# Patient Record
Sex: Female | Born: 1970 | Race: White | Hispanic: No | Marital: Married | Smoking: Never smoker
Health system: Southern US, Community
[De-identification: ages and names within clinical notes are randomized; demographics above are authoritative.]

## PROBLEM LIST (undated history)

## (undated) DIAGNOSIS — T753XXA Motion sickness, initial encounter: Secondary | ICD-10-CM

## (undated) DIAGNOSIS — F32A Depression, unspecified: Secondary | ICD-10-CM

## (undated) DIAGNOSIS — E669 Obesity, unspecified: Secondary | ICD-10-CM

## (undated) DIAGNOSIS — N924 Excessive bleeding in the premenopausal period: Secondary | ICD-10-CM

## (undated) DIAGNOSIS — Z973 Presence of spectacles and contact lenses: Secondary | ICD-10-CM

## (undated) DIAGNOSIS — Q512 Other doubling of uterus, unspecified: Secondary | ICD-10-CM

## (undated) DIAGNOSIS — Q51820 Cervical duplication: Secondary | ICD-10-CM

## (undated) DIAGNOSIS — L709 Acne, unspecified: Secondary | ICD-10-CM

## (undated) DIAGNOSIS — Q5128 Other doubling of uterus, other specified: Secondary | ICD-10-CM

## (undated) DIAGNOSIS — F329 Major depressive disorder, single episode, unspecified: Secondary | ICD-10-CM

## (undated) HISTORY — DX: Excessive bleeding in the premenopausal period: N92.4

## (undated) HISTORY — DX: Other and unspecified doubling of uterus: Q51.28

## (undated) HISTORY — DX: Cervical duplication: Q51.820

## (undated) HISTORY — DX: Acne, unspecified: L70.9

## (undated) HISTORY — PX: SKIN LESION EXCISION: SHX2412

## (undated) HISTORY — DX: Obesity, unspecified: E66.9

## (undated) HISTORY — DX: Other doubling of uterus, unspecified: Q51.20

## (undated) HISTORY — DX: Depression, unspecified: F32.A

## (undated) HISTORY — DX: Major depressive disorder, single episode, unspecified: F32.9

---

## 2004-01-16 ENCOUNTER — Other Ambulatory Visit: Admission: RE | Admit: 2004-01-16 | Discharge: 2004-01-16 | Payer: Self-pay | Admitting: Obstetrics and Gynecology

## 2005-11-13 ENCOUNTER — Ambulatory Visit: Payer: Self-pay | Admitting: Obstetrics & Gynecology

## 2008-05-11 ENCOUNTER — Encounter: Admission: RE | Admit: 2008-05-11 | Discharge: 2008-05-11 | Payer: Self-pay | Admitting: Family Medicine

## 2008-10-31 ENCOUNTER — Encounter: Admission: RE | Admit: 2008-10-31 | Discharge: 2008-10-31 | Payer: Self-pay | Admitting: Family Medicine

## 2009-04-25 ENCOUNTER — Encounter: Admission: RE | Admit: 2009-04-25 | Discharge: 2009-04-25 | Payer: Self-pay | Admitting: Family Medicine

## 2011-03-17 ENCOUNTER — Other Ambulatory Visit: Payer: Self-pay | Admitting: Unknown Physician Specialty

## 2011-03-20 ENCOUNTER — Other Ambulatory Visit: Payer: Self-pay | Admitting: Family Medicine

## 2011-03-20 DIAGNOSIS — N644 Mastodynia: Secondary | ICD-10-CM

## 2011-03-21 ENCOUNTER — Other Ambulatory Visit: Payer: Self-pay | Admitting: Family Medicine

## 2011-03-21 DIAGNOSIS — N920 Excessive and frequent menstruation with regular cycle: Secondary | ICD-10-CM

## 2011-03-31 ENCOUNTER — Ambulatory Visit
Admission: RE | Admit: 2011-03-31 | Discharge: 2011-03-31 | Disposition: A | Discharge status: Home / Self Care | Payer: BC Managed Care – PPO | Source: Ambulatory Visit | Attending: Family Medicine | Admitting: Family Medicine

## 2011-03-31 DIAGNOSIS — N920 Excessive and frequent menstruation with regular cycle: Secondary | ICD-10-CM

## 2011-03-31 DIAGNOSIS — N644 Mastodynia: Secondary | ICD-10-CM

## 2012-05-13 ENCOUNTER — Ambulatory Visit: Payer: Self-pay

## 2012-05-13 LAB — TROPONIN I: Troponin-I: <0.02 ng/mL

## 2012-05-13 LAB — URINALYSIS, COMPLETE
Leukocyte Esterase: NEGATIVE
Nitrite: NEGATIVE
Protein: NEGATIVE
Specific Gravity: 1.005 (ref 1.003–1.030)

## 2012-05-13 LAB — COMPREHENSIVE METABOLIC PANEL
Alkaline Phosphatase: 58 U/L (ref 50–136)
BUN: 20 mg/dL — ABNORMAL HIGH (ref 7–18)
Calcium, Total: 9.7 mg/dL (ref 8.5–10.1)
EGFR (African American): >60
EGFR (Non-African Amer.): >60
Glucose: 89 mg/dL (ref 65–99)
Osmolality: 287 (ref 275–301)

## 2012-05-13 LAB — CBC WITH DIFFERENTIAL/PLATELET
Basophil %: 1 %
Eosinophil #: 0 10*3/uL (ref 0.0–0.7)
HGB: 13.8 g/dL (ref 12.0–16.0)
MCH: 33.2 pg (ref 26.0–34.0)
MCHC: 33.8 g/dL (ref 32.0–36.0)
MCV: 98 fL (ref 80–100)
Monocyte #: 0.4 x10 3/mm (ref 0.2–0.9)
Neutrophil #: 4.3 10*3/uL (ref 1.4–6.5)

## 2012-11-11 ENCOUNTER — Other Ambulatory Visit: Payer: Self-pay

## 2012-11-11 DIAGNOSIS — Z1231 Encounter for screening mammogram for malignant neoplasm of breast: Secondary | ICD-10-CM

## 2012-12-15 ENCOUNTER — Ambulatory Visit
Admission: RE | Admit: 2012-12-15 | Discharge: 2012-12-15 | Disposition: A | Discharge status: Home / Self Care | Payer: BC Managed Care – PPO | Source: Ambulatory Visit

## 2012-12-15 DIAGNOSIS — Z1231 Encounter for screening mammogram for malignant neoplasm of breast: Secondary | ICD-10-CM

## 2012-12-16 ENCOUNTER — Other Ambulatory Visit: Payer: Self-pay | Admitting: Family Medicine

## 2012-12-16 DIAGNOSIS — N6452 Nipple discharge: Secondary | ICD-10-CM

## 2013-04-08 ENCOUNTER — Ambulatory Visit
Admission: RE | Admit: 2013-04-08 | Discharge: 2013-04-08 | Disposition: A | Discharge status: Home / Self Care | Payer: BC Managed Care – PPO | Source: Ambulatory Visit | Attending: Family Medicine | Admitting: Family Medicine

## 2013-04-08 DIAGNOSIS — N6452 Nipple discharge: Secondary | ICD-10-CM

## 2015-10-18 ENCOUNTER — Other Ambulatory Visit: Payer: Self-pay

## 2015-10-18 DIAGNOSIS — Z1231 Encounter for screening mammogram for malignant neoplasm of breast: Secondary | ICD-10-CM

## 2015-11-01 ENCOUNTER — Ambulatory Visit
Admission: RE | Admit: 2015-11-01 | Discharge: 2015-11-01 | Disposition: A | Discharge status: Home / Self Care | Payer: BC Managed Care – PPO | Source: Ambulatory Visit

## 2015-11-01 DIAGNOSIS — Z1231 Encounter for screening mammogram for malignant neoplasm of breast: Secondary | ICD-10-CM

## 2015-12-26 ENCOUNTER — Ambulatory Visit (INDEPENDENT_AMBULATORY_CARE_PROVIDER_SITE_OTHER): Payer: BC Managed Care – PPO | Admitting: Unknown Physician Specialty

## 2015-12-26 ENCOUNTER — Encounter: Payer: Self-pay | Admitting: Unknown Physician Specialty

## 2015-12-26 VITALS — BP 126/77 | HR 66 | Temp 98.1°F | Ht 67.2 in | Wt 169.2 lb

## 2015-12-26 DIAGNOSIS — N938 Other specified abnormal uterine and vaginal bleeding: Secondary | ICD-10-CM

## 2015-12-26 DIAGNOSIS — Z0000 Encounter for general adult medical examination without abnormal findings: Secondary | ICD-10-CM | POA: Diagnosis not present

## 2015-12-26 DIAGNOSIS — Q51820 Cervical duplication: Secondary | ICD-10-CM

## 2015-12-26 MED ORDER — LEVONORGESTREL-ETHINYL ESTRAD 0.1-20 MG-MCG PO TABS: 1.0000 | ORAL_TABLET | Freq: Every day | ORAL | Status: DC

## 2015-12-26 NOTE — Progress Notes (Signed)
BP 126/77 mmHg  Pulse 66  Temp(Src) 98.1 F (36.7 C)  Ht 5' 7.2" (1.707 m)  Wt 169 lb 3.2 oz (76.749 kg)  BMI 26.34 kg/m2  SpO2 100%  LMP 12/07/2015 (Exact Date)   Subjective:    Patient ID: Jaclyn West, female    DOB: 01/18/1971, 45 y.o.   MRN: [id number]  HPI: Jaclyn West is a 45 y.o. female  Chief Complaint  Patient presents with  . Establish Care  . Annual Exam   Menstrual bleeding Pt states she is having intermittent clots for about a week, then heavy bleeding and pain for about 2 days.  Periods are monthly.  She is getting a lot of PMS symptoms of wanting to eat "everything that is not nailed down."    Past Surgical History  Procedure Laterality Date  . Cesarean section      x 2  . Skin lesion excision     Past Medical History  Diagnosis Date  . Obesity   . Depression   . Acne   . Uterus didelphys   . Double cervix   . Premenopause menorrhagia       Relevant past medical, surgical, family and social history reviewed and updated as indicated. Interim medical history since our last visit reviewed. Allergies and medications reviewed and updated.  Review of Systems  Per HPI unless specifically indicated above     Objective:    BP 126/77 mmHg  Pulse 66  Temp(Src) 98.1 F (36.7 C)  Ht 5' 7.2" (1.707 m)  Wt 169 lb 3.2 oz (76.749 kg)  BMI 26.34 kg/m2  SpO2 100%  LMP 12/07/2015 (Exact Date)  Wt Readings from Last 3 Encounters:  12/26/15 169 lb 3.2 oz (76.749 kg)    Physical Exam  Constitutional: She is oriented to person, place, and time. She appears well-developed and well-nourished.  HENT:  Head: Normocephalic and atraumatic.  Eyes: Pupils are equal, round, and reactive to light. Right eye exhibits no discharge. Left eye exhibits no discharge. No scleral icterus.  Neck: Normal range of motion. Neck supple. Carotid bruit is not present. No thyromegaly present.  Cardiovascular: Normal rate, regular rhythm and normal heart sounds.  Exam reveals  no gallop and no friction rub.   No murmur heard. Pulmonary/Chest: Effort normal and breath sounds normal. No respiratory distress. She has no wheezes. She has no rales.  Abdominal: Soft. Bowel sounds are normal. There is no tenderness. There is no rebound.  Genitourinary: Vagina normal. No breast swelling, tenderness or discharge.  Pt with double cervix and uterus consistent with exam.    Musculoskeletal: Normal range of motion.  Lymphadenopathy:    She has no cervical adenopathy.  Neurological: She is alert and oriented to person, place, and time.  Skin: Skin is warm, dry and intact. No rash noted.  Psychiatric: She has a normal mood and affect. Her speech is normal and behavior is normal. Judgment and thought content normal. Cognition and memory are normal.    Results for orders placed or performed in visit on 05/13/12  Troponin I  Result Value Ref Range   Troponin-I < 0.02 ng/mL      Assessment & Plan:   Problem List Items Addressed This Visit      Unprioritized   Double cervix    Discussed pt with Ob-Gyn who stated no further work-up needed as her periods are monthly and 7 days. Will rx BCPs for continuous use and recheck in 3 months  Uterine bleeding, dysfunctional - Primary   Relevant Orders   CBC with Differential/Platelet    Other Visit Diagnoses    Annual physical exam        Relevant Orders    Comprehensive metabolic panel    TSH    IGP, Aptima HPV, rfx 16/18,45    Lipid Panel w/o Chol/HDL Ratio        Follow up plan: Return in about 3 months (around 03/27/2016) for for 30 minutes as also needs wart removal.

## 2015-12-26 NOTE — Assessment & Plan Note (Addendum)
Discussed pt with Ob-Gyn who stated no further work-up needed as her periods are monthly and 7 days. Will rx BCPs for continuous use and recheck in 3 months

## 2015-12-27 LAB — COMPREHENSIVE METABOLIC PANEL
A/G RATIO: 1.7 (ref 1.2–2.2)
ALK PHOS: 65 IU/L (ref 39–117)
ALT: 18 IU/L (ref 0–32)
AST: 17 IU/L (ref 0–40)
Albumin: 4.5 g/dL (ref 3.5–5.5)
BILIRUBIN TOTAL: 0.3 mg/dL (ref 0.0–1.2)
BUN/Creatinine Ratio: 17 (ref 9–23)
BUN: 16 mg/dL (ref 6–24)
CALCIUM: 10 mg/dL (ref 8.7–10.2)
CHLORIDE: 100 mmol/L (ref 96–106)
CO2: 22 mmol/L (ref 18–29)
Creatinine, Ser: 0.92 mg/dL (ref 0.57–1.00)
GFR calc Af Amer: 88 mL/min/{1.73_m2} (ref >59)
GFR, EST NON AFRICAN AMERICAN: 76 mL/min/{1.73_m2} (ref >59)
GLOBULIN, TOTAL: 2.6 g/dL (ref 1.5–4.5)
Glucose: 87 mg/dL (ref 65–99)
POTASSIUM: 5.5 mmol/L — AB (ref 3.5–5.2)
SODIUM: 140 mmol/L (ref 134–144)
Total Protein: 7.1 g/dL (ref 6.0–8.5)

## 2015-12-27 LAB — CBC WITH DIFFERENTIAL/PLATELET
BASOS: 1 %
Basophils Absolute: 0.1 10*3/uL (ref 0.0–0.2)
EOS (ABSOLUTE): 0.2 10*3/uL (ref 0.0–0.4)
EOS: 3 %
HEMATOCRIT: 42.1 % (ref 34.0–46.6)
Hemoglobin: 13.6 g/dL (ref 11.1–15.9)
Immature Grans (Abs): 0 10*3/uL (ref 0.0–0.1)
Immature Granulocytes: 0 %
LYMPHS ABS: 2.3 10*3/uL (ref 0.7–3.1)
Lymphs: 37 %
MCH: 31.3 pg (ref 26.6–33.0)
MCHC: 32.3 g/dL (ref 31.5–35.7)
MCV: 97 fL (ref 79–97)
MONOS ABS: 0.5 10*3/uL (ref 0.1–0.9)
Monocytes: 7 %
NEUTROS ABS: 3.2 10*3/uL (ref 1.4–7.0)
Neutrophils: 52 %
PLATELETS: 302 10*3/uL (ref 150–379)
RBC: 4.35 x10E6/uL (ref 3.77–5.28)
RDW: 13.6 % (ref 12.3–15.4)
WBC: 6.3 10*3/uL (ref 3.4–10.8)

## 2015-12-27 LAB — LIPID PANEL W/O CHOL/HDL RATIO
CHOLESTEROL TOTAL: 199 mg/dL (ref 100–199)
HDL: 77 mg/dL (ref >39)
LDL CALC: 113 mg/dL — AB (ref 0–99)
Triglycerides: 47 mg/dL (ref 0–149)
VLDL Cholesterol Cal: 9 mg/dL (ref 5–40)

## 2015-12-27 LAB — TSH: TSH: 1.97 u[IU]/mL (ref 0.450–4.500)

## 2015-12-28 ENCOUNTER — Encounter: Payer: Self-pay | Admitting: Unknown Physician Specialty

## 2015-12-29 LAB — IGP, APTIMA HPV, RFX 16/18,45
HPV Aptima: NEGATIVE
PAP Smear Comment: 0

## 2016-03-28 ENCOUNTER — Ambulatory Visit: Payer: BC Managed Care – PPO | Admitting: Unknown Physician Specialty

## 2016-04-14 ENCOUNTER — Ambulatory Visit
Admission: EM | Admit: 2016-04-14 | Discharge: 2016-04-14 | Disposition: A | Discharge status: Home / Self Care | Payer: BC Managed Care – PPO | Attending: Family Medicine | Admitting: Family Medicine

## 2016-04-14 ENCOUNTER — Telehealth: Payer: Self-pay | Admitting: Unknown Physician Specialty

## 2016-04-14 ENCOUNTER — Ambulatory Visit: Payer: BC Managed Care – PPO

## 2016-04-14 ENCOUNTER — Ambulatory Visit (INDEPENDENT_AMBULATORY_CARE_PROVIDER_SITE_OTHER)
Admit: 2016-04-14 | Discharge: 2016-04-14 | Disposition: A | Discharge status: Home / Self Care | Payer: BC Managed Care – PPO | Attending: Family Medicine | Admitting: Family Medicine

## 2016-04-14 ENCOUNTER — Encounter: Payer: Self-pay | Admitting: Emergency Medicine

## 2016-04-14 DIAGNOSIS — G4489 Other headache syndrome: Secondary | ICD-10-CM

## 2016-04-14 LAB — PREGNANCY, URINE: PREG TEST UR: NEGATIVE

## 2016-04-14 MED ORDER — BUTALBITAL-APAP-CAFFEINE 50-325-40 MG PO TABS: 1.0000 | ORAL_TABLET | Freq: Four times a day (QID) | ORAL | 0 refills | Status: DC | PRN

## 2016-04-14 NOTE — Telephone Encounter (Signed)
Routing to provider  

## 2016-04-14 NOTE — ED Provider Notes (Signed)
[facility]    CSN: [id number] Arrival date & time: 04/14/16  [id number]     History   Chief Complaint Chief Complaint  Patient presents with  . Headache    HPI Jaclyn West is a 45 y.o. female.   Patient's here because of of headache headache started Friday. She states she normally does not have headaches. Only time she's had headaches was twice when she was pregnant and when she had stopped caffeine because the pregnancy. She denies having total or no signs of trauma and she denies any infection or illness causing her pain she denies any nausea no photophobia but states the headache is throbbing she try to run this weekend and was unable to run. She states she's had some hip pain and she take some Aleve for that was wondering whether the Aleve may cause her headache. She does not smoke she states that she denies any chronic medication no significant family medical problems no known drug allergies. 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 She denies any family medical history is significant pertinent to today's visit   The history is provided by the patient. No language interpreter was used.  Headache  Pain location:  Occipital Quality:  Sharp Radiates to:  Does not radiate Severity currently:  7/10 Severity at highest:  8/10 Onset quality:  Sudden Timing:  Constant Progression:  Worsening Chronicity:  New Similar to prior headaches: no   Context: not activity, not exposure to bright light, not caffeine, not coughing, not defecating, not eating, not stress, not exposure to cold air, not intercourse, not loud noise and not straining   Relieved by:  NSAIDs Worsened by:  Activity Ineffective treatments:  None tried Associated symptoms: myalgias   Associated symptoms: no back pain, no eye pain, no neck pain and no photophobia     Past Medical History:  Diagnosis Date  . Acne   . Depression   . Double cervix   . Obesity   .  Premenopause menorrhagia   . Uterus didelphys     Patient Active Problem List   Diagnosis Date Noted  . Uterine bleeding, dysfunctional 12/26/2015  . Double cervix 12/26/2015    Past Surgical History:  Procedure Laterality Date  . CESAREAN SECTION     x 2  . SKIN LESION EXCISION      OB History    No data available       Home Medications    Prior to Admission medications   Medication Sig Start Date End Date Taking? Authorizing Provider  Melatonin 1 MG TABS Take by mouth as needed.   Yes Historical Provider, MD  levonorgestrel-ethinyl estradiol (AVIANE,ALESSE,LESSINA) 0.1-20 MG-MCG tablet Take 1 tablet by mouth daily. Continuous use of active pills.  Skip placebo pills 12/26/15   Kathrine Haddock, NP    Family History Family History  Problem Relation Age of Onset  . Hypothyroidism Mother   . Hypertension Mother   . Arthritis Mother   . Heart disease Father     MI  . Heart disease Brother   . Asthma Son   . Heart disease Maternal Grandmother     CHF  . Stroke Maternal Grandfather   . Heart disease Paternal Grandfather   . Heart disease Brother     Social History Social History  Substance Use Topics  . Smoking status: Never Smoker  . Smokeless tobacco: Never Used  . Alcohol use No     Allergies   Iodinated diagnostic  agents and Red dye   Review of Systems Review of Systems  Eyes: Negative for photophobia, pain, discharge, redness and visual disturbance.  Musculoskeletal: Positive for myalgias. Negative for back pain and neck pain.  Neurological: Positive for headaches.  All other systems reviewed and are negative.    Physical Exam Triage Vital Signs ED Triage Vitals  Enc Vitals Group     BP 04/14/16 0835 (!) 149/91     Pulse Rate 04/14/16 0835 63     Resp 04/14/16 0835 16     Temp 04/14/16 0835 98.1 F (36.7 C)     Temp Source 04/14/16 0835 Oral     SpO2 04/14/16 0835 100 %     Weight 04/14/16 0835 160 lb (72.6 kg)     Height 04/14/16 0835 5'  7" (1.702 m)     Head Circumference --      Peak Flow --      Pain Score 04/14/16 0836 6     Pain Loc --      Pain Edu? --      Excl. in [place]? --    No data found.   Updated Vital Signs BP (!) 149/91 (BP Location: Left Arm)   Pulse 63   Temp 98.1 F (36.7 C) (Oral)   Resp 16   Ht 5\' 7"  (1.702 m)   Wt 160 lb (72.6 kg)   LMP 02/13/2016 (Approximate)   SpO2 100%   BMI 25.06 kg/m   Visual Acuity Right Eye Distance:   Left Eye Distance:   Bilateral Distance:    Right Eye Near:   Left Eye Near:    Bilateral Near:     Physical Exam  Constitutional: She is oriented to person, place, and time. She appears well-developed and well-nourished. No distress.  HENT:  Head: Normocephalic and atraumatic.    Right Ear: External ear normal.  Left Ear: External ear normal.  Mouth/Throat: Oropharynx is clear and moist.  Tenderness over the left occipital area and was able to reduce pain of the eccentric area when this area was palpated  Eyes: Conjunctivae and EOM are normal. Pupils are equal, round, and reactive to light.  Neck: Normal range of motion. Neck supple.  Pulmonary/Chest: Effort normal.  Musculoskeletal: Normal range of motion. She exhibits tenderness. She exhibits no edema.  Lymphadenopathy:    She has no cervical adenopathy.  Neurological: She is alert and oriented to person, place, and time. She has normal reflexes. No cranial nerve deficit. Coordination normal.  Skin: Skin is warm and dry. She is not diaphoretic.  Psychiatric: She has a normal mood and affect.  Vitals reviewed.    UC Treatments / Results  Labs (all labs ordered are listed, but only abnormal results are displayed) Labs Reviewed  PREGNANCY, URINE    EKG  EKG Interpretation None       Radiology Ct Head Wo Contrast  Result Date: 04/14/2016 CLINICAL DATA:  Headache for 2 days along the base of the skull. EXAM: CT HEAD WITHOUT CONTRAST TECHNIQUE: Contiguous axial images were obtained from  the base of the skull through the vertex without intravenous contrast. COMPARISON:  None. FINDINGS: Brain: The brainstem, cerebellum, cerebral peduncles, thalami, basal ganglia, basilar cisterns, and ventricular system appear within normal limits. Slight hypodensity in the white matter along the occipital horn a right lateral ventricle is ascribed to volume averaging of the slightly asymmetric occipital horn, essentially normal variant. No intracranial hemorrhage, mass lesion, or acute CVA. Vascular: Unremarkable Skull: Unremarkable Sinuses/Orbits:  Unremarkable Other: No supplemental non-categorized findings. IMPRESSION: 1. No significant intracranial abnormality is identified to explain the patient's symptoms. Electronically Signed   By: Van Clines M.D.   On: 04/14/2016 11:09    Procedures Procedures (including critical care time)  Medications Ordered in UC Medications - No data to display   Initial Impression / Assessment and Plan / UC Course  I have reviewed the triage vital signs and the nursing notes.  Pertinent labs & imaging results that were available during my care of the patient were reviewed by me and considered in my medical decision making (see chart for details).  Clinical Course   At this time two different pathways could be taken. We have a middle-aged female with new onset headaches worsen the life and we can get diagnostic tests to make sure that there is nothing wrong intracranially such as a bleed or mass. Or so some very suspicious this could be a occipital headache we could go and try to inject the left occipital notch/groove and see if we can make improvement she is opted for the CT scan and will go with that I did explain to her that we can do one of the other but not both today.  Final Clinical Impressions(s) / UC Diagnoses   Final diagnoses:  Other headache syndrome    New Prescriptions New Prescriptions   No medications on file    CT scan was negative  we'll place on fioricet 1 tablet every 6-8 hours when necessary basis and if headache does not improve to please follow-up with PCP later this week or next week.   Frederich Cha, MD 04/14/16 [phone]

## 2016-04-14 NOTE — ED Triage Notes (Signed)
Patient c/o headache that started Friday night.

## 2016-04-14 NOTE — Telephone Encounter (Signed)
Called and left patient a VM letting her know what Cheryl said.  

## 2016-04-14 NOTE — Telephone Encounter (Signed)
Pt called stated she is at the Urgent Care in [place]. She has had a headache for 3 days this is abnormal. Do we do injections for Occipital headaches at the base of the skull or does she need to go somewhere else. Please call pt back ASAP. Thanks.

## 2016-04-14 NOTE — Telephone Encounter (Signed)
No, we do not

## 2016-04-16 ENCOUNTER — Encounter: Payer: Self-pay | Admitting: Family Medicine

## 2016-04-16 ENCOUNTER — Ambulatory Visit (INDEPENDENT_AMBULATORY_CARE_PROVIDER_SITE_OTHER): Payer: BC Managed Care – PPO | Admitting: Family Medicine

## 2016-04-16 VITALS — BP 132/86 | HR 67 | Temp 98.5°F | Wt 171.0 lb

## 2016-04-16 DIAGNOSIS — G4489 Other headache syndrome: Secondary | ICD-10-CM

## 2016-04-16 DIAGNOSIS — G44209 Tension-type headache, unspecified, not intractable: Secondary | ICD-10-CM | POA: Diagnosis not present

## 2016-04-16 MED ORDER — TRIAMCINOLONE ACETONIDE 40 MG/ML IJ SUSP
40.0000 mg | Freq: Once | INTRAMUSCULAR | Status: AC
  Administered 2016-04-16: 40 mg via INTRAMUSCULAR

## 2016-04-16 MED ORDER — CYCLOBENZAPRINE HCL 10 MG PO TABS: 10.0000 mg | ORAL_TABLET | Freq: Three times a day (TID) | ORAL | 0 refills | Status: DC | PRN

## 2016-04-16 NOTE — Patient Instructions (Signed)
Follow up as needed

## 2016-04-16 NOTE — Progress Notes (Signed)
   BP 132/86   Pulse 67   Temp 98.5 F (36.9 C)   Wt 171 lb (77.6 kg)   LMP 02/13/2016 (Approximate)   SpO2 100%   BMI 26.78 kg/m    Subjective:    Patient ID: Jaclyn West, female    DOB: [birth year], 45 y.o.   MRN: [id number]  HPI: Jaclyn West is a 45 y.o. female  Chief Complaint  Patient presents with  . Headache    since Friday at the base of her skull. Sometimes throbbing pain, but mostly a dull ache. Went to UC on Monday.They gave her Fiorcet but she didn't take it.    Patient presents with 5 day history of persistent HA in occipital region. Minimal light sensitivity, some mild intermittent nausea. Denies visual changes. Took ibuprofen over the weekend, went to UC 2 days ago and was given fioricet but has not taken any. What has helped the most is putting a heating pad over the area and soaking in hot epsom salt baths. It is some better today. Denies fever, chills, CP, SOB, weakness, or dizziness.   Relevant past medical, surgical, family and social history reviewed and updated as indicated. Interim medical history since our last visit reviewed. Allergies and medications reviewed and updated.  Review of Systems  Constitutional: Negative.   Eyes: Positive for photophobia.  Respiratory: Negative.   Cardiovascular: Negative.   Gastrointestinal: Positive for nausea.  Genitourinary: Negative.   Musculoskeletal: Negative.   Skin: Negative.   Neurological: Positive for headaches.  Psychiatric/Behavioral: Negative.     Per HPI unless specifically indicated above     Objective:    BP 132/86   Pulse 67   Temp 98.5 F (36.9 C)   Wt 171 lb (77.6 kg)   LMP 02/13/2016 (Approximate)   SpO2 100%   BMI 26.78 kg/m   Wt Readings from Last 3 Encounters:  04/16/16 171 lb (77.6 kg)  04/14/16 160 lb (72.6 kg)  12/26/15 169 lb 3.2 oz (76.7 kg)    Physical Exam  Constitutional: She is oriented to person, place, and time. She appears well-developed and well-nourished. No  distress.  HENT:  Head: Atraumatic.  Eyes: Conjunctivae are normal. Pupils are equal, round, and reactive to light. No scleral icterus.  Neck: Normal range of motion. Neck supple.  Cardiovascular: Normal rate and normal heart sounds.   Pulmonary/Chest: Effort normal and breath sounds normal. No respiratory distress.  Musculoskeletal: Normal range of motion.  Moderate TTP over base of head and posterior neck, mild TTP over b/l trapezius muscles   Lymphadenopathy:    She has no cervical adenopathy.  Neurological: She is alert and oriented to person, place, and time.  Skin: Skin is warm and dry.  Psychiatric: She has a normal mood and affect. Her behavior is normal.  Nursing note and vitals reviewed.     Assessment & Plan:   Problem List Items Addressed This Visit    None    Visit Diagnoses    Acute non intractable tension-type headache    -  Primary   IM Kenalog given today, flexeril sent. Recommended to continue heating pad and epsom soaks, rest, can take one or two doses of fioricet over the next day or two   Relevant Medications   cyclobenzaprine (FLEXERIL) 10 MG tablet   triamcinolone acetonide (KENALOG-40) injection 40 mg (Completed)       Follow up plan: Return if symptoms worsen or fail to improve.

## 2016-04-22 ENCOUNTER — Ambulatory Visit (INDEPENDENT_AMBULATORY_CARE_PROVIDER_SITE_OTHER): Payer: BC Managed Care – PPO | Admitting: Family Medicine

## 2016-04-22 ENCOUNTER — Encounter: Payer: Self-pay | Admitting: Family Medicine

## 2016-04-22 VITALS — BP 133/84 | HR 72 | Temp 98.2°F | Ht 68.1 in | Wt 173.6 lb

## 2016-04-22 DIAGNOSIS — J069 Acute upper respiratory infection, unspecified: Secondary | ICD-10-CM | POA: Diagnosis not present

## 2016-04-22 MED ORDER — AMOXICILLIN-POT CLAVULANATE 875-125 MG PO TABS: 1.0000 | ORAL_TABLET | Freq: Two times a day (BID) | ORAL | 0 refills | Status: DC

## 2016-04-22 MED ORDER — LIDOCAINE VISCOUS 2 % MT SOLN: 5.0000 mL | OROMUCOSAL | 0 refills | Status: DC | PRN

## 2016-04-22 NOTE — Patient Instructions (Signed)
Follow up as needed

## 2016-04-22 NOTE — Progress Notes (Signed)
   BP 133/84 (BP Location: Left Arm, Patient Position: Sitting, Cuff Size: Large)   Pulse 72   Temp 98.2 F (36.8 C)   Ht 5' 8.1" (1.73 m)   Wt 173 lb 9.6 oz (78.7 kg)   LMP 02/13/2016   SpO2 98%   BMI 26.32 kg/m    Subjective:    Patient ID: Jaclyn West, female    DOB: 09-28-1970, 45 y.o.   MRN: [id number]  HPI: Jaclyn West is a 45 y.o. female  Chief Complaint  Patient presents with  . Sore Throat    pt states her throat started bothering her last week and has gotten worse, states it is almost unbearable    Patient presents with a 1 week history of congestion, and cough. Severe sore throat x 3-4 days, worst at night. States her throat feels like it is on fire and she can't eat or sleep. Intermittent fevers and chills. 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   Relevant past medical, surgical, family and social history reviewed and updated as indicated. Interim medical history since our last visit reviewed. Allergies and medications reviewed and updated.  Review of Systems  Constitutional: Positive for chills and fever.  HENT: Positive for congestion and sore throat.   Eyes: Negative.   Respiratory: Positive for cough.   Cardiovascular: Negative.   Gastrointestinal: Negative.   Genitourinary: Negative.   Musculoskeletal: Positive for myalgias.  Skin: Negative.   Neurological: Negative.   Psychiatric/Behavioral: Negative.     Per HPI unless specifically indicated above     Objective:    BP 133/84 (BP Location: Left Arm, Patient Position: Sitting, Cuff Size: Large)   Pulse 72   Temp 98.2 F (36.8 C)   Ht 5' 8.1" (1.73 m)   Wt 173 lb 9.6 oz (78.7 kg)   LMP 02/13/2016   SpO2 98%   BMI 26.32 kg/m   Wt Readings from Last 3 Encounters:  04/22/16 173 lb 9.6 oz (78.7 kg)  04/16/16 171 lb (77.6 kg)  04/14/16 160 lb (72.6 kg)    Physical Exam  Results for orders placed or performed during the hospital  encounter of 04/14/16  Pregnancy, urine  Result Value Ref Range   Preg Test, Ur NEGATIVE NEGATIVE      Assessment & Plan:   Problem List Items Addressed This Visit    None    Visit Diagnoses    Upper respiratory tract infection, unspecified type    -  Primary   Strep neg, likely viral but recommended if no better in the next day or two to start the augmentin. Await strep cx. Viscous lidocaine sent.    Relevant Orders   Rapid strep screen (not at [facility])       Follow up plan: Return if symptoms worsen or fail to improve.

## 2016-04-24 LAB — RAPID STREP SCREEN (MED CTR MEBANE ONLY): Strep Gp A Ag, IA W/Reflex: NEGATIVE

## 2016-04-24 LAB — CULTURE, GROUP A STREP: STREP A CULTURE: NEGATIVE

## 2016-05-05 ENCOUNTER — Ambulatory Visit (INDEPENDENT_AMBULATORY_CARE_PROVIDER_SITE_OTHER): Payer: BC Managed Care – PPO | Admitting: Unknown Physician Specialty

## 2016-05-05 ENCOUNTER — Encounter: Payer: Self-pay | Admitting: Unknown Physician Specialty

## 2016-05-05 VITALS — BP 133/86 | HR 76 | Temp 98.3°F | Ht 67.1 in | Wt 167.2 lb

## 2016-05-05 DIAGNOSIS — Z23 Encounter for immunization: Secondary | ICD-10-CM | POA: Diagnosis not present

## 2016-05-05 DIAGNOSIS — N938 Other specified abnormal uterine and vaginal bleeding: Secondary | ICD-10-CM | POA: Diagnosis not present

## 2016-05-05 NOTE — Patient Instructions (Addendum)

## 2016-05-05 NOTE — Progress Notes (Signed)
   BP 133/86 (BP Location: Left Arm, Patient Position: Sitting, Cuff Size: Normal)   Pulse 76   Temp 98.3 F (36.8 C)   Ht 5' 7.1" (1.704 m)   Wt 167 lb 3.2 oz (75.8 kg)   LMP 02/13/2016   SpO2 98%   BMI 26.11 kg/m    Subjective:    Patient ID: Jaclyn West, female    DOB: 21-Apr-1971, 45 y.o.   MRN: [id number]  HPI: Jaclyn West is a 45 y.o. female  Chief Complaint  Patient presents with  . Double Cervix    3 month f/up   Pt would like a refill of her BCPs.  She has been having trouble with headaches and sound like tension type headaches.  She is doing well on continuous use without PMS.    Relevant past medical, surgical, family and social history reviewed and updated as indicated. Interim medical history since our last visit reviewed. Allergies and medications reviewed and updated.  Review of Systems  Per HPI unless specifically indicated above     Objective:    BP 133/86 (BP Location: Left Arm, Patient Position: Sitting, Cuff Size: Normal)   Pulse 76   Temp 98.3 F (36.8 C)   Ht 5' 7.1" (1.704 m)   Wt 167 lb 3.2 oz (75.8 kg)   LMP 02/13/2016   SpO2 98%   BMI 26.11 kg/m   Wt Readings from Last 3 Encounters:  05/05/16 167 lb 3.2 oz (75.8 kg)  04/22/16 173 lb 9.6 oz (78.7 kg)  04/16/16 171 lb (77.6 kg)    Physical Exam  Constitutional: She is oriented to person, place, and time. She appears well-developed and well-nourished. No distress.  HENT:  Head: Normocephalic and atraumatic.  Eyes: Conjunctivae and lids are normal. Right eye exhibits no discharge. Left eye exhibits no discharge. No scleral icterus.  Cardiovascular: Normal rate.   Pulmonary/Chest: Effort normal.  Abdominal: Normal appearance. There is no splenomegaly or hepatomegaly.  Musculoskeletal: Normal range of motion.  Neurological: She is alert and oriented to person, place, and time.  Skin: Skin is intact. No rash noted. No pallor.  Psychiatric: She has a normal mood and affect. Her behavior  is normal. Judgment and thought content normal.    Results for orders placed or performed in visit on 04/22/16  Rapid strep screen (not at [facility])  Result Value Ref Range   Strep Gp A Ag, IA W/Reflex Negative Negative  Culture, Group A Strep  Result Value Ref Range   Strep A Culture Negative       Assessment & Plan:   Problem List Items Addressed This Visit      Unprioritized   Uterine bleeding, dysfunctional    Treated with continuous BCPs and doing well       Other Visit Diagnoses    Need for influenza vaccination    -  Primary   Relevant Orders   Flu Vaccine QUAD 36+ mos IM (Completed)       Follow up plan: Return if symptoms worsen or fail to improve.

## 2016-05-05 NOTE — Assessment & Plan Note (Signed)
Treated with continuous BCPs and doing well

## 2016-08-13 ENCOUNTER — Ambulatory Visit (INDEPENDENT_AMBULATORY_CARE_PROVIDER_SITE_OTHER): Payer: BC Managed Care – PPO | Admitting: Family Medicine

## 2016-08-13 ENCOUNTER — Encounter: Payer: Self-pay | Admitting: Family Medicine

## 2016-08-13 VITALS — BP 118/71 | HR 80 | Temp 98.8°F | Wt 168.4 lb

## 2016-08-13 DIAGNOSIS — J0100 Acute maxillary sinusitis, unspecified: Secondary | ICD-10-CM

## 2016-08-13 LAB — VERITOR FLU A/B WAIVED
INFLUENZA B: NEGATIVE
Influenza A: NEGATIVE

## 2016-08-13 MED ORDER — HYDROCOD POLST-CPM POLST ER 10-8 MG/5ML PO SUER: 5.0000 mL | Freq: Two times a day (BID) | ORAL | 0 refills | Status: DC | PRN

## 2016-08-13 MED ORDER — AMOXICILLIN-POT CLAVULANATE 875-125 MG PO TABS: 1.0000 | ORAL_TABLET | Freq: Two times a day (BID) | ORAL | 0 refills | Status: DC

## 2016-08-13 NOTE — Progress Notes (Signed)
BP 118/71 (BP Location: Left Arm, Patient Position: Sitting, Cuff Size: Normal)   Pulse 80   Temp 98.8 F (37.1 C)   Wt 168 lb 6.4 oz (76.4 kg)   SpO2 100%   BMI 26.30 kg/m    Subjective:    Patient ID: Jaclyn West, female    DOB: 1971-04-23, 46 y.o.   MRN: [id number]  HPI: Jaclyn West is a 46 y.o. female  Chief Complaint  Patient presents with  . Nasal Congestion  . Sinusitis  . Cough  . Fever   Patient states she began with mild cold symptoms last week, then started running a fever about 4-5 days ago. Tmax 102. Facial pain and pressure b/l especially behind eyes, cough at night. Some body aches and chills. Taking ibuprofen and nyquil for nighttime, states she can't do cold medicines. No sick contacts.   Relevant past medical, surgical, family and social history reviewed and updated as indicated. Interim medical history since our last visit reviewed. Allergies and medications reviewed and updated.  Review of Systems  Constitutional: Positive for chills, fatigue and fever.  HENT: Positive for congestion, ear pain, sinus pain, sinus pressure and sore throat.   Eyes: Negative.   Respiratory: Positive for cough.   Gastrointestinal: Negative.   Genitourinary: Negative.   Musculoskeletal: Positive for myalgias.  Neurological: Positive for headaches.  Psychiatric/Behavioral: Negative.    Past Medical History:  Diagnosis Date  . Acne   . Depression   . Double cervix   . Obesity   . Premenopause menorrhagia   . Uterus didelphys    Social History   Social History  . Marital status: Married    Spouse name: N/A  . Number of children: N/A  . Years of education: N/A   Occupational History  . Not on file.   Social History Main Topics  . Smoking status: Never Smoker  . Smokeless tobacco: Never Used  . Alcohol use No  . Drug use: No  . Sexual activity: Yes   Other Topics Concern  . Not on file   Social History Narrative  . No narrative on file    Per HPI  unless specifically indicated above     Objective:    BP 118/71 (BP Location: Left Arm, Patient Position: Sitting, Cuff Size: Normal)   Pulse 80   Temp 98.8 F (37.1 C)   Wt 168 lb 6.4 oz (76.4 kg)   SpO2 100%   BMI 26.30 kg/m   Wt Readings from Last 3 Encounters:  08/13/16 168 lb 6.4 oz (76.4 kg)  05/05/16 167 lb 3.2 oz (75.8 kg)  04/22/16 173 lb 9.6 oz (78.7 kg)    Physical Exam  Constitutional: She is oriented to person, place, and time. She appears well-developed and well-nourished. No distress.  HENT:  Head: Atraumatic.  B/l middle ear effusion Thick drainage in nares, mucosa erythematous b/l B/l sinus TTP   Eyes: Conjunctivae are normal. Pupils are equal, round, and reactive to light. No scleral icterus.  Neck: Normal range of motion. Neck supple.  Cardiovascular: Normal rate and normal heart sounds.   Pulmonary/Chest: Effort normal and breath sounds normal. No respiratory distress.  Musculoskeletal: Normal range of motion.  Lymphadenopathy:    She has no cervical adenopathy.  Neurological: She is alert and oriented to person, place, and time.  Skin: Skin is warm and dry.  Psychiatric: She has a normal mood and affect. Her behavior is normal.  Nursing note and vitals reviewed.  Assessment & Plan:   Problem List Items Addressed This Visit    None    Visit Diagnoses    Acute maxillary sinusitis, recurrence not specified    -  Primary   Rapid flu negative. Will treat with augmentin and tussionex. Supportive care discussed. F/u if no improvement   Relevant Medications   amoxicillin-clavulanate (AUGMENTIN) 875-125 MG tablet   chlorpheniramine-HYDROcodone (TUSSIONEX PENNKINETIC ER) 10-8 MG/5ML SUER   Other Relevant Orders   Veritor Flu A/B Waived       Follow up plan: Return if symptoms worsen or fail to improve.

## 2016-08-13 NOTE — Patient Instructions (Signed)
Follow up as needed

## 2016-12-11 ENCOUNTER — Other Ambulatory Visit: Payer: Self-pay | Admitting: Unknown Physician Specialty

## 2017-06-15 ENCOUNTER — Encounter: Payer: Self-pay | Admitting: *Deleted

## 2017-06-15 ENCOUNTER — Ambulatory Visit
Admission: EM | Admit: 2017-06-15 | Discharge: 2017-06-15 | Disposition: A | Discharge status: Home / Self Care | Payer: BC Managed Care – PPO | Attending: Family Medicine | Admitting: Family Medicine

## 2017-06-15 DIAGNOSIS — Z8261 Family history of arthritis: Secondary | ICD-10-CM | POA: Diagnosis not present

## 2017-06-15 DIAGNOSIS — R0602 Shortness of breath: Secondary | ICD-10-CM | POA: Diagnosis not present

## 2017-06-15 DIAGNOSIS — X58XXXA Exposure to other specified factors, initial encounter: Secondary | ICD-10-CM | POA: Diagnosis not present

## 2017-06-15 DIAGNOSIS — R079 Chest pain, unspecified: Secondary | ICD-10-CM | POA: Diagnosis present

## 2017-06-15 DIAGNOSIS — S86811A Strain of other muscle(s) and tendon(s) at lower leg level, right leg, initial encounter: Secondary | ICD-10-CM | POA: Diagnosis not present

## 2017-06-15 DIAGNOSIS — Z8349 Family history of other endocrine, nutritional and metabolic diseases: Secondary | ICD-10-CM | POA: Diagnosis not present

## 2017-06-15 DIAGNOSIS — R110 Nausea: Secondary | ICD-10-CM | POA: Diagnosis not present

## 2017-06-15 DIAGNOSIS — Z825 Family history of asthma and other chronic lower respiratory diseases: Secondary | ICD-10-CM | POA: Diagnosis not present

## 2017-06-15 DIAGNOSIS — Y9302 Activity, running: Secondary | ICD-10-CM | POA: Diagnosis not present

## 2017-06-15 DIAGNOSIS — R0789 Other chest pain: Secondary | ICD-10-CM | POA: Diagnosis not present

## 2017-06-15 DIAGNOSIS — Z8249 Family history of ischemic heart disease and other diseases of the circulatory system: Secondary | ICD-10-CM | POA: Diagnosis not present

## 2017-06-15 LAB — COMPREHENSIVE METABOLIC PANEL
ALT: 21 U/L (ref 14–54)
ANION GAP: 8 (ref 5–15)
AST: 24 U/L (ref 15–41)
Albumin: 4.2 g/dL (ref 3.5–5.0)
Alkaline Phosphatase: 50 U/L (ref 38–126)
BILIRUBIN TOTAL: 0.4 mg/dL (ref 0.3–1.2)
BUN: 17 mg/dL (ref 6–20)
CHLORIDE: 99 mmol/L — AB (ref 101–111)
CO2: 29 mmol/L (ref 22–32)
Calcium: 9.5 mg/dL (ref 8.9–10.3)
Creatinine, Ser: 0.72 mg/dL (ref 0.44–1.00)
Glucose, Bld: 91 mg/dL (ref 65–99)
POTASSIUM: 4.1 mmol/L (ref 3.5–5.1)
Sodium: 136 mmol/L (ref 135–145)
TOTAL PROTEIN: 7.3 g/dL (ref 6.5–8.1)

## 2017-06-15 LAB — CBC WITH DIFFERENTIAL/PLATELET
BASOS ABS: 0.1 10*3/uL (ref 0–0.1)
Basophils Relative: 1 %
EOS PCT: 1 %
Eosinophils Absolute: 0.1 10*3/uL (ref 0–0.7)
HCT: 40.4 % (ref 35.0–47.0)
HEMOGLOBIN: 13.6 g/dL (ref 12.0–16.0)
LYMPHS ABS: 2.9 10*3/uL (ref 1.0–3.6)
LYMPHS PCT: 33 %
MCH: 32.5 pg (ref 26.0–34.0)
MCHC: 33.7 g/dL (ref 32.0–36.0)
MCV: 96.7 fL (ref 80.0–100.0)
Monocytes Absolute: 0.6 10*3/uL (ref 0.2–0.9)
Monocytes Relative: 7 %
NEUTROS ABS: 5.1 10*3/uL (ref 1.4–6.5)
Neutrophils Relative %: 58 %
PLATELETS: 229 10*3/uL (ref 150–440)
RBC: 4.18 MIL/uL (ref 3.80–5.20)
RDW: 12.5 % (ref 11.5–14.5)
WBC: 8.7 10*3/uL (ref 3.6–11.0)

## 2017-06-15 LAB — TROPONIN I: Troponin I: <0.03 ng/mL (ref <0.03)

## 2017-06-15 LAB — FIBRIN DERIVATIVES D-DIMER (ARMC ONLY): Fibrin derivatives D-dimer (ARMC): 171.91 ng/mL (FEU) (ref 0.00–499.00)

## 2017-06-15 NOTE — ED Triage Notes (Signed)
Pt is a runner and last Thursday had sudden onset right calf pain. States nothing unusual about this run, denies injury. Yesterday, while at rest, sudden onset left chest pain, non-radiating, "tightness", with mild dyspnea. Pain has been intermittent since onset. Pain is uneffected by  movement, resp, or body position.

## 2017-06-15 NOTE — ED Provider Notes (Signed)
[facility]    CSN: [id number] Arrival date & time: 06/15/17  1450  History   Chief Complaint Chief Complaint  Patient presents with  . Chest Pain  . Leg Pain  . Shortness of Breath   HPI  46 year old female presents with the above complaints.  Patient is a runner.  She states that on Thursday she went for a run and developed right calf pain during her run.  She states that she has not ran since then.  She has been icing the area and taking ibuprofen without resolution.  She states that her calf pain has been worse today.  No reports of swelling or redness.  Additionally, patient developed chest pain yesterday.  Occurred at rest.  Left-sided.  Described as tightness.  No radiation.  3-4/10 in severity.  Associated mild shortness of breath.  Also reporting nausea.  No diaphoresis.  Chest pain has been intermittent.  No known exacerbating or relieving factors.  No other reported symptoms.  No other complaints at this time.  Past Medical History:  Diagnosis Date  . Acne   . Depression   . Double cervix   . Obesity   . Premenopause menorrhagia   . Uterus didelphys    Patient Active Problem List   Diagnosis Date Noted  . Uterine bleeding, dysfunctional 12/26/2015  . Double cervix 12/26/2015   Past Surgical History:  Procedure Laterality Date  . CESAREAN SECTION     x 2  . SKIN LESION EXCISION     OB History    No data available     Home Medications    Prior to Admission medications   Medication Sig Start Date End Date Taking? Authorizing Provider  Melatonin 1 MG TABS Take by mouth as needed.   Yes [provider]   Family History Family History  Problem Relation Age of Onset  . Hypothyroidism Mother   . Hypertension Mother   . Arthritis Mother   . Heart disease Father        MI  . Heart disease Brother   . Asthma Son   . Heart disease Maternal Grandmother        CHF  . Stroke Maternal Grandfather   . Heart disease Paternal Grandfather     . Heart disease Brother    Social History Social History   Tobacco Use  . Smoking status: Never Smoker  . Smokeless tobacco: Never Used  Substance Use Topics  . Alcohol use: No    Alcohol/week: 0.0 oz  . Drug use: No   Allergies   Iodinated diagnostic agents and Red dye  Review of Systems Review of Systems  Respiratory: Positive for chest tightness and shortness of breath.   Cardiovascular: Positive for chest pain.  Gastrointestinal: Positive for nausea.  Musculoskeletal:       Calf pain.    Physical Exam Triage Vital Signs ED Triage Vitals [06/15/17 1514]  Enc Vitals Group     BP (!) 142/80     Pulse Rate 77     Resp 16     Temp 98.9 F (37.2 C)     Temp Source Oral     SpO2 100 %     Weight 174 lb (78.9 kg)     Height 5\' 7"  (1.702 m)     Head Circumference      Peak Flow      Pain Score 4     Pain Loc      Pain Edu?  Excl. in [place]?    Updated Vital Signs BP (!) 142/80 (BP Location: Left Arm)   Pulse 77   Temp 98.9 F (37.2 C) (Oral)   Resp 16   Ht 5\' 7"  (1.702 m)   Wt 174 lb (78.9 kg)   LMP 06/01/2017   SpO2 100%   BMI 27.25 kg/m    Physical Exam  Constitutional: She is oriented to person, place, and time. She appears well-developed and well-nourished. No distress.  HENT:  Head: Normocephalic and atraumatic.  Cardiovascular: Normal rate and regular rhythm.  No murmur heard. Pulmonary/Chest: Effort normal and breath sounds normal. No respiratory distress. She has no wheezes. She has no rales.  Musculoskeletal:  Right calf - nontender to palpation. No appreciable erythema or swelling.  Pain with dorsiflexion.  Neurological: She is alert and oriented to person, place, and time.  Skin: Skin is warm.  Psychiatric: She has a normal mood and affect. Her behavior is normal.  Vitals reviewed.  UC Treatments / Results  Labs (all labs ordered are listed, but only abnormal results are displayed) Labs Reviewed  COMPREHENSIVE METABOLIC PANEL -  Abnormal; Notable for the following components:      Result Value   Chloride 99 (*)    All other components within normal limits  CBC WITH DIFFERENTIAL/PLATELET  TROPONIN I  FIBRIN DERIVATIVES D-DIMER ([facility] ONLY)    EKG Normal sinus rhythm with a rate of 72.  Probable LAE.  No ST or T wave changes.   Radiology No results found.  Procedures Procedures (including critical care time)  Medications Ordered in UC Medications - No data to display   Initial Impression / Assessment and Plan / UC Course  I have reviewed the triage vital signs and the nursing notes.  Pertinent labs & imaging results that were available during my care of the patient were reviewed by me and considered in my medical decision making (see chart for details).   46 year old female presents with calf pain, atypical chest pain, and associated shortness of breath.  Troponin negative.  EKG unremarkable.  No clinical evidence of DVT.  D-dimer negative.  Complete workup is unremarkable.  Patient likely has a strained calf muscle from running and musculoskeletal chest pain and/or anxiety induced chest pain/shortness of breath.  Advised supportive care and over-the-counter ibuprofen as needed.  ** Wells score 0.   Final Clinical Impressions(s) / UC Diagnoses   Final diagnoses:  Strain of calf muscle, right, initial encounter  Atypical chest pain    ED Discharge Orders    None     Controlled Substance Prescriptions [place] Controlled Substance Registry consulted? Not Applicable   Coral Spikes, DO 06/15/17 1643

## 2017-06-15 NOTE — Discharge Instructions (Signed)
If you worsen, go to the ED.  Work up was negative.  Ibuprofen as needed.  Take care  Dr. Lacinda Axon

## 2017-08-14 ENCOUNTER — Ambulatory Visit: Payer: BC Managed Care – PPO | Admitting: Unknown Physician Specialty

## 2017-09-14 ENCOUNTER — Ambulatory Visit: Payer: BC Managed Care – PPO | Admitting: Unknown Physician Specialty

## 2017-09-28 ENCOUNTER — Ambulatory Visit: Payer: BC Managed Care – PPO | Admitting: Unknown Physician Specialty

## 2017-11-05 ENCOUNTER — Other Ambulatory Visit: Payer: Self-pay | Admitting: Unknown Physician Specialty

## 2017-11-05 DIAGNOSIS — Z1231 Encounter for screening mammogram for malignant neoplasm of breast: Secondary | ICD-10-CM

## 2017-11-09 ENCOUNTER — Ambulatory Visit (INDEPENDENT_AMBULATORY_CARE_PROVIDER_SITE_OTHER): Payer: BC Managed Care – PPO | Admitting: Unknown Physician Specialty

## 2017-11-09 ENCOUNTER — Encounter: Payer: Self-pay | Admitting: Unknown Physician Specialty

## 2017-11-09 DIAGNOSIS — D171 Benign lipomatous neoplasm of skin and subcutaneous tissue of trunk: Secondary | ICD-10-CM | POA: Insufficient documentation

## 2017-11-09 NOTE — Progress Notes (Signed)
BP 113/77   Pulse 63   Temp 98.3 F (36.8 C) (Oral)   Ht 5' 6.7" (1.694 m)   Wt 177 lb (80.3 kg)   SpO2 98%   BMI 27.97 kg/m    Subjective:    Patient ID: Jaclyn West, female    DOB: [birth year], 46 y.o.   MRN: [id number]  HPI: Jaclyn West is a 47 y.o. female  Chief Complaint  Patient presents with  . Cyst    pt states she has a knot on her right side, rib area. States it has been there for about a year but has become more noticable in the last year   Lump Pt with lump RUQ abdomen.  States it has been there for a year.  Pt reports no pain.    Relevant past medical, surgical, family and social history reviewed and updated as indicated. Interim medical history since our last visit reviewed. Allergies and medications reviewed and updated.  Review of Systems  Constitutional: Negative.   Respiratory: Negative.   Cardiovascular: Negative.   Psychiatric/Behavioral: Negative.     Per HPI unless specifically indicated above     Objective:    BP 113/77   Pulse 63   Temp 98.3 F (36.8 C) (Oral)   Ht 5' 6.7" (1.694 m)   Wt 177 lb (80.3 kg)   SpO2 98%   BMI 27.97 kg/m   Wt Readings from Last 3 Encounters:  11/09/17 177 lb (80.3 kg)  06/15/17 174 lb (78.9 kg)  08/13/16 168 lb 6.4 oz (76.4 kg)    Physical Exam  Constitutional: She is oriented to person, place, and time. She appears well-developed and well-nourished. No distress.  HENT:  Head: Normocephalic and atraumatic.  Eyes: Conjunctivae and lids are normal. Right eye exhibits no discharge. Left eye exhibits no discharge. No scleral icterus.  Cardiovascular: Normal rate.  Pulmonary/Chest: Effort normal.  Abdominal: Normal appearance. There is no splenomegaly or hepatomegaly.  Soft 3-5 cm mass which is soft and freely movable underneath skin RUQ  Musculoskeletal: Normal range of motion.  Neurological: She is alert and oriented to person, place, and time.  Skin: Skin is intact. No rash noted. No pallor.    Psychiatric: She has a normal mood and affect. Her behavior is normal. Judgment and thought content normal.    Results for orders placed or performed during the hospital encounter of 06/15/17  Comprehensive metabolic panel  Result Value Ref Range   Sodium 136 135 - 145 mmol/L   Potassium 4.1 3.5 - 5.1 mmol/L   Chloride 99 (L) 101 - 111 mmol/L   CO2 29 22 - 32 mmol/L   Glucose, Bld 91 65 - 99 mg/dL   BUN 17 6 - 20 mg/dL   Creatinine, Ser 0.72 0.44 - 1.00 mg/dL   Calcium 9.5 8.9 - 10.3 mg/dL   Total Protein 7.3 6.5 - 8.1 g/dL   Albumin 4.2 3.5 - 5.0 g/dL   AST 24 15 - 41 U/L   ALT 21 14 - 54 U/L   Alkaline Phosphatase 50 38 - 126 U/L   Total Bilirubin 0.4 0.3 - 1.2 mg/dL   GFR calc non Af Amer >60 >60 mL/min   GFR calc Af Amer >60 >60 mL/min   Anion gap 8 5 - 15  CBC with Differential  Result Value Ref Range   WBC 8.7 3.6 - 11.0 K/uL   RBC 4.18 3.80 - 5.20 MIL/uL   Hemoglobin 13.6 12.0 - 16.0 g/dL  HCT 40.4 35.0 - 47.0 %   MCV 96.7 80.0 - 100.0 fL   MCH 32.5 26.0 - 34.0 pg   MCHC 33.7 32.0 - 36.0 g/dL   RDW 12.5 11.5 - 14.5 %   Platelets 229 150 - 440 K/uL   Neutrophils Relative % 58 %   Neutro Abs 5.1 1.4 - 6.5 K/uL   Lymphocytes Relative 33 %   Lymphs Abs 2.9 1.0 - 3.6 K/uL   Monocytes Relative 7 %   Monocytes Absolute 0.6 0.2 - 0.9 K/uL   Eosinophils Relative 1 %   Eosinophils Absolute 0.1 0 - 0.7 K/uL   Basophils Relative 1 %   Basophils Absolute 0.1 0 - 0.1 K/uL  Troponin I  Result Value Ref Range   Troponin I <0.03 <0.03 ng/mL  Fibrin derivatives D-Dimer ([facility] only)  Result Value Ref Range   Fibrin derivatives D-dimer ([facility]) 171.91 0.00 - 499.00 ng/mL (FEU)      Assessment & Plan:   Problem List Items Addressed This Visit      Unprioritized   Lipoma of abdominal wall    Pt is concerned with enlarging size.  Will order [place] of area      Relevant Orders   US Abdomen Limited RUQ       Follow up plan: Return if symptoms worsen or fail to  improve.

## 2017-11-09 NOTE — Assessment & Plan Note (Signed)
Pt is concerned with enlarging size.  Will order [place] of area

## 2017-11-16 ENCOUNTER — Telehealth: Payer: Self-pay | Admitting: Unknown Physician Specialty

## 2017-11-16 ENCOUNTER — Ambulatory Visit
Admission: RE | Admit: 2017-11-16 | Discharge: 2017-11-16 | Disposition: A | Discharge status: Home / Self Care | Payer: BC Managed Care – PPO | Source: Ambulatory Visit | Attending: Unknown Physician Specialty | Admitting: Unknown Physician Specialty

## 2017-11-16 ENCOUNTER — Other Ambulatory Visit: Payer: Self-pay | Admitting: Unknown Physician Specialty

## 2017-11-16 DIAGNOSIS — D171 Benign lipomatous neoplasm of skin and subcutaneous tissue of trunk: Secondary | ICD-10-CM

## 2017-11-16 NOTE — Telephone Encounter (Signed)
Anderson Malta with [facility] needs a new order put in by [facility] just stating that patient needs only soft tissue abdomen scan for lump on side. She took verbal order over the phone but needs a signed order also   Thank you

## 2017-11-27 ENCOUNTER — Ambulatory Visit: Payer: BC Managed Care – PPO

## 2017-12-09 ENCOUNTER — Encounter: Payer: BC Managed Care – PPO | Admitting: Unknown Physician Specialty

## 2018-01-19 ENCOUNTER — Ambulatory Visit (INDEPENDENT_AMBULATORY_CARE_PROVIDER_SITE_OTHER): Payer: BC Managed Care – PPO | Admitting: Unknown Physician Specialty

## 2018-01-19 ENCOUNTER — Encounter: Payer: Self-pay | Admitting: Unknown Physician Specialty

## 2018-01-19 VITALS — BP 130/83 | HR 57 | Temp 98.4°F | Ht 66.7 in | Wt 176.4 lb

## 2018-01-19 DIAGNOSIS — N938 Other specified abnormal uterine and vaginal bleeding: Secondary | ICD-10-CM | POA: Diagnosis not present

## 2018-01-19 DIAGNOSIS — Z0000 Encounter for general adult medical examination without abnormal findings: Secondary | ICD-10-CM

## 2018-01-19 DIAGNOSIS — Q51820 Cervical duplication: Secondary | ICD-10-CM | POA: Diagnosis not present

## 2018-01-19 NOTE — Progress Notes (Signed)
BP 130/83   Pulse (!) 57   Temp 98.4 F (36.9 C) (Oral)   Ht 5' 6.7" (1.694 m)   Wt 176 lb 6.4 oz (80 kg)   LMP 12/29/2017 (Approximate)   SpO2 100%   BMI 27.88 kg/m    Subjective:    Patient ID: Jaclyn West, female    DOB: 09/04/1970, 47 y.o.   MRN: [id number]  HPI: Jaclyn West is a 47 y.o. female  Chief Complaint  Patient presents with  . Annual Exam   Relevant past medical, surgical, family and social history reviewed and updated as indicated. Interim medical history since our last visit reviewed. Allergies and medications reviewed and updated.  Review of Systems  Constitutional: Negative.   HENT: Negative.   Eyes: Negative.   Respiratory: Negative.   Cardiovascular: Negative.   Gastrointestinal: Negative.   Endocrine: Negative.   Genitourinary: Negative.   Musculoskeletal: Negative.   Skin: Negative.   Allergic/Immunologic: Negative.   Neurological: Negative.   Hematological: Negative.   Psychiatric/Behavioral: Negative.     Per HPI unless specifically indicated above     Objective:    BP 130/83   Pulse (!) 57   Temp 98.4 F (36.9 C) (Oral)   Ht 5' 6.7" (1.694 m)   Wt 176 lb 6.4 oz (80 kg)   LMP 12/29/2017 (Approximate)   SpO2 100%   BMI 27.88 kg/m   Wt Readings from Last 3 Encounters:  01/19/18 176 lb 6.4 oz (80 kg)  11/09/17 177 lb (80.3 kg)  06/15/17 174 lb (78.9 kg)    Physical Exam  Constitutional: She is oriented to person, place, and time. She appears well-developed and well-nourished.  HENT:  Head: Normocephalic and atraumatic.  Eyes: Pupils are equal, round, and reactive to light. Right eye exhibits no discharge. Left eye exhibits no discharge. No scleral icterus.  Neck: Normal range of motion. Neck supple. Carotid bruit is not present. No thyromegaly present.  Cardiovascular: Normal rate, regular rhythm and normal heart sounds. Exam reveals no gallop and no friction rub.  No murmur heard. Pulmonary/Chest: Effort normal and breath  sounds normal. No respiratory distress. She has no wheezes. She has no rales. No breast tenderness or discharge.  Abdominal: Soft. Bowel sounds are normal. There is no tenderness. There is no rebound.  Genitourinary: No breast tenderness or discharge.  Musculoskeletal: Normal range of motion.  Lymphadenopathy:    She has no cervical adenopathy.  Neurological: She is alert and oriented to person, place, and time.  Skin: Skin is warm, dry and intact. No rash noted.  Psychiatric: She has a normal mood and affect. Her speech is normal and behavior is normal. Judgment and thought content normal. Cognition and memory are normal.     Assessment & Plan:   Problem List Items Addressed This Visit      Unprioritized   Double cervix - Primary    Referred to gyn for continued management      Relevant Orders   Ambulatory referral to Gynecology   Uterine bleeding, dysfunctional    Off of BCPs and she is doing well with regular periods but some cramping and heavy bleeding      Relevant Orders   Ferritin    Other Visit Diagnoses    Annual physical exam       Relevant Orders   CBC with Differential/Platelet   Comprehensive metabolic panel   Lipid Panel w/o Chol/HDL Ratio   TSH   MM DIGITAL SCREENING BILATERAL  Follow up plan: Return in about 1 year (around 01/20/2019).

## 2018-01-19 NOTE — Assessment & Plan Note (Signed)
Off of BCPs and she is doing well with regular periods but some cramping and heavy bleeding

## 2018-01-19 NOTE — Patient Instructions (Addendum)
Please do call to schedule your mammogram; the number to schedule one at either [facility] or [facility] is [phone] ----------------------------------------------------------------    Preventive Care 40-64 Years, Female Preventive care refers to lifestyle choices and visits with your health care provider that can promote health and wellness. What does preventive care include?  A yearly physical exam. This is also called an annual well check.  Dental exams once or twice a year.  Routine eye exams. Ask your health care provider how often you should have your eyes checked.  Personal lifestyle choices, including: ? Daily care of your teeth and gums. ? Regular physical activity. ? Eating a healthy diet. ? Avoiding tobacco and drug use. ? Limiting alcohol use. ? Practicing safe sex. ? Taking low-dose aspirin daily starting at age 50. ? Taking vitamin and mineral supplements as recommended by your health care provider. What happens during an annual well check? The services and screenings done by your health care provider during your annual well check will depend on your age, overall health, lifestyle risk factors, and family history of disease. Counseling Your health care provider may ask you questions about your:  Alcohol use.  Tobacco use.  Drug use.  Emotional well-being.  Home and relationship well-being.  Sexual activity.  Eating habits.  Work and work environment.  Method of birth control.  Menstrual cycle.  Pregnancy history.  Screening You may have the following tests or measurements:  Height, weight, and BMI.  Blood pressure.  Lipid and cholesterol levels. These may be checked every 5 years, or more frequently if you are over 50 years old.  Skin check.  Lung cancer screening. You may have this screening every year starting at age 55 if you have a 30-pack-year history of smoking and currently smoke or have quit within  the past 15 years.  Fecal occult blood test (FOBT) of the stool. You may have this test every year starting at age 50.  Flexible sigmoidoscopy or colonoscopy. You may have a sigmoidoscopy every 5 years or a colonoscopy every 10 years starting at age 50.  Hepatitis C blood test.  Hepatitis B blood test.  Sexually transmitted disease (STD) testing.  Diabetes screening. This is done by checking your blood sugar (glucose) after you have not eaten for a while (fasting). You may have this done every 1-3 years.  Mammogram. This may be done every 1-2 years. Talk to your health care provider about when you should start having regular mammograms. This may depend on whether you have a family history of breast cancer.  BRCA-related cancer screening. This may be done if you have a family history of breast, ovarian, tubal, or peritoneal cancers.  Pelvic exam and Pap test. This may be done every 3 years starting at age 21. Starting at age 30, this may be done every 5 years if you have a Pap test in combination with an HPV test.  Bone density scan. This is done to screen for osteoporosis. You may have this scan if you are at high risk for osteoporosis.  Discuss your test results, treatment options, and if necessary, the need for more tests with your health care provider. Vaccines Your health care provider may recommend certain vaccines, such as:  Influenza vaccine. This is recommended every year.  Tetanus, diphtheria, and acellular pertussis (Tdap, Td) vaccine. You may need a Td booster every 10 years.  Varicella vaccine. You may need this if you have not been vaccinated.  Zoster vaccine.   You may need this after age 60.  Measles, mumps, and rubella (MMR) vaccine. You may need at least one dose of MMR if you were born in 1957 or later. You may also need a second dose.  Pneumococcal 13-valent conjugate (PCV13) vaccine. You may need this if you have certain conditions and were not previously  vaccinated.  Pneumococcal polysaccharide (PPSV23) vaccine. You may need one or two doses if you smoke cigarettes or if you have certain conditions.  Meningococcal vaccine. You may need this if you have certain conditions.  Hepatitis A vaccine. You may need this if you have certain conditions or if you travel or work in places where you may be exposed to hepatitis A.  Hepatitis B vaccine. You may need this if you have certain conditions or if you travel or work in places where you may be exposed to hepatitis B.  Haemophilus influenzae type b (Hib) vaccine. You may need this if you have certain conditions.  Talk to your health care provider about which screenings and vaccines you need and how often you need them. This information is not intended to replace advice given to you by your health care provider. Make sure you discuss any questions you have with your health care provider. Document Released: 07/13/2015 Document Revised: 03/05/2016 Document Reviewed: 04/17/2015 Elsevier Interactive Patient Education  2018 Elsevier Inc.  

## 2018-01-19 NOTE — Assessment & Plan Note (Signed)
Referred to gyn for continued management

## 2018-01-20 ENCOUNTER — Telehealth: Payer: Self-pay | Admitting: Unknown Physician Specialty

## 2018-01-20 DIAGNOSIS — R748 Abnormal levels of other serum enzymes: Secondary | ICD-10-CM

## 2018-01-20 LAB — COMPREHENSIVE METABOLIC PANEL
A/G RATIO: 1.9 (ref 1.2–2.2)
ALK PHOS: 61 IU/L (ref 39–117)
ALT: 115 IU/L — ABNORMAL HIGH (ref 0–32)
AST: 88 IU/L — AB (ref 0–40)
Albumin: 4.4 g/dL (ref 3.5–5.5)
BUN/Creatinine Ratio: 18 (ref 9–23)
BUN: 15 mg/dL (ref 6–24)
Bilirubin Total: 0.3 mg/dL (ref 0.0–1.2)
CO2: 24 mmol/L (ref 20–29)
Calcium: 9.8 mg/dL (ref 8.7–10.2)
Chloride: 103 mmol/L (ref 96–106)
Creatinine, Ser: 0.84 mg/dL (ref 0.57–1.00)
GFR calc Af Amer: 96 mL/min/{1.73_m2} (ref >59)
GFR calc non Af Amer: 84 mL/min/{1.73_m2} (ref >59)
GLOBULIN, TOTAL: 2.3 g/dL (ref 1.5–4.5)
Glucose: 88 mg/dL (ref 65–99)
Potassium: 5.2 mmol/L (ref 3.5–5.2)
SODIUM: 141 mmol/L (ref 134–144)
Total Protein: 6.7 g/dL (ref 6.0–8.5)

## 2018-01-20 LAB — LIPID PANEL W/O CHOL/HDL RATIO
CHOLESTEROL TOTAL: 231 mg/dL — AB (ref 100–199)
HDL: 72 mg/dL (ref >39)
LDL Calculated: 148 mg/dL — ABNORMAL HIGH (ref 0–99)
TRIGLYCERIDES: 56 mg/dL (ref 0–149)
VLDL Cholesterol Cal: 11 mg/dL (ref 5–40)

## 2018-01-20 LAB — CBC WITH DIFFERENTIAL/PLATELET
BASOS: 1 %
Basophils Absolute: 0.1 10*3/uL (ref 0.0–0.2)
EOS (ABSOLUTE): 0 10*3/uL (ref 0.0–0.4)
EOS: 1 %
HEMATOCRIT: 44.1 % (ref 34.0–46.6)
Hemoglobin: 14.5 g/dL (ref 11.1–15.9)
Immature Grans (Abs): 0 10*3/uL (ref 0.0–0.1)
Immature Granulocytes: 0 %
LYMPHS ABS: 2.3 10*3/uL (ref 0.7–3.1)
Lymphs: 38 %
MCH: 32.5 pg (ref 26.6–33.0)
MCHC: 32.9 g/dL (ref 31.5–35.7)
MCV: 99 fL — AB (ref 79–97)
MONOS ABS: 0.3 10*3/uL (ref 0.1–0.9)
Monocytes: 5 %
Neutrophils Absolute: 3.3 10*3/uL (ref 1.4–7.0)
Neutrophils: 55 %
Platelets: 253 10*3/uL (ref 150–450)
RBC: 4.46 x10E6/uL (ref 3.77–5.28)
RDW: 12.5 % (ref 12.3–15.4)
WBC: 6 10*3/uL (ref 3.4–10.8)

## 2018-01-20 LAB — FERRITIN: Ferritin: 29 ng/mL (ref 15–150)

## 2018-01-20 LAB — TSH: TSH: 2.68 u[IU]/mL (ref 0.450–4.500)

## 2018-01-20 NOTE — Telephone Encounter (Signed)
Noted, thank you

## 2018-01-20 NOTE — Telephone Encounter (Signed)
Elevated liver enzymes.    Drinks no ETOH.    Recheck next month with Hepatitis C

## 2018-01-22 ENCOUNTER — Encounter: Payer: Self-pay | Admitting: Unknown Physician Specialty

## 2018-03-10 ENCOUNTER — Telehealth: Payer: Self-pay | Admitting: Radiology

## 2018-03-10 NOTE — Telephone Encounter (Signed)
Spoke with patient and scheduled appointment from referal

## 2018-04-20 NOTE — Progress Notes (Deleted)
Last pap  Mammogram

## 2018-04-21 ENCOUNTER — Encounter: Payer: BC Managed Care – PPO | Admitting: Family Medicine

## 2018-04-21 DIAGNOSIS — Z01419 Encounter for gynecological examination (general) (routine) without abnormal findings: Secondary | ICD-10-CM

## 2018-04-27 NOTE — Telephone Encounter (Signed)
Patient will be in soon for labs.

## 2018-05-25 ENCOUNTER — Other Ambulatory Visit: Payer: BC Managed Care – PPO

## 2018-05-25 DIAGNOSIS — R748 Abnormal levels of other serum enzymes: Secondary | ICD-10-CM

## 2018-05-26 LAB — COMPREHENSIVE METABOLIC PANEL
ALK PHOS: 46 IU/L (ref 39–117)
ALT: 12 IU/L (ref 0–32)
AST: 15 IU/L (ref 0–40)
Albumin/Globulin Ratio: 2.2 (ref 1.2–2.2)
Albumin: 4.6 g/dL (ref 3.5–5.5)
BILIRUBIN TOTAL: 0.3 mg/dL (ref 0.0–1.2)
BUN/Creatinine Ratio: 21 (ref 9–23)
BUN: 18 mg/dL (ref 6–24)
CHLORIDE: 101 mmol/L (ref 96–106)
CO2: 21 mmol/L (ref 20–29)
CREATININE: 0.86 mg/dL (ref 0.57–1.00)
Calcium: 9.7 mg/dL (ref 8.7–10.2)
GFR calc Af Amer: 93 mL/min/{1.73_m2} (ref >59)
GFR calc non Af Amer: 81 mL/min/{1.73_m2} (ref >59)
GLOBULIN, TOTAL: 2.1 g/dL (ref 1.5–4.5)
GLUCOSE: 89 mg/dL (ref 65–99)
Potassium: 4.6 mmol/L (ref 3.5–5.2)
SODIUM: 139 mmol/L (ref 134–144)
Total Protein: 6.7 g/dL (ref 6.0–8.5)

## 2018-07-15 ENCOUNTER — Telehealth: Payer: Self-pay | Admitting: Unknown Physician Specialty

## 2018-07-15 NOTE — Telephone Encounter (Signed)
Copied from [place] [phone]. Topic: Quick Communication - See Telephone Encounter >> Jul 15, 2018  4:18 PM Blase Mess A wrote: CRM for notification. See Telephone encounter for: 07/15/18. [name] is calling from [facility] is requesting the paitent's last blood work. [phone] They will be faxing a medical release form as well.

## 2018-07-15 NOTE — Telephone Encounter (Signed)
Noted! Thank you

## 2018-12-17 ENCOUNTER — Other Ambulatory Visit: Payer: Self-pay

## 2018-12-17 ENCOUNTER — Encounter: Payer: Self-pay | Admitting: Nurse Practitioner

## 2018-12-17 ENCOUNTER — Ambulatory Visit (INDEPENDENT_AMBULATORY_CARE_PROVIDER_SITE_OTHER): Payer: BC Managed Care – PPO | Admitting: Nurse Practitioner

## 2018-12-17 VITALS — HR 76 | Temp 98.4°F | Wt 190.0 lb

## 2018-12-17 DIAGNOSIS — F320 Major depressive disorder, single episode, mild: Secondary | ICD-10-CM | POA: Diagnosis not present

## 2018-12-17 MED ORDER — BUPROPION HCL ER (XL) 150 MG PO TB24: 150.0000 mg | ORAL_TABLET | Freq: Every day | ORAL | 2 refills | Status: DC

## 2018-12-17 NOTE — Patient Instructions (Signed)

## 2018-12-17 NOTE — Progress Notes (Signed)
Pulse 76   Temp 98.4 F (36.9 C)   Wt 190 lb (86.2 kg)   BMI 30.03 kg/m    Subjective:    Patient ID: Jaclyn West, female    DOB: 08-12-1970, 48 y.o.   MRN: [id number]  HPI: Jaclyn West is a 48 y.o. female  Chief Complaint  Patient presents with  . Depression    . This visit was completed via WebEx due to the restrictions of the COVID-19 pandemic. All issues as above were discussed and addressed. Physical exam was done as above through visual confirmation on WebEx. If it was felt that the patient should be evaluated in the office, they were directed there. The patient verbally consented to this visit. . Location of the patient: home . Location of the provider: home . Those involved with this call:  . Provider: Marnee Guarneri, DNP . CMA: Tiffany Reel, CMA . Front Desk/Registration: Jill Side  . Time spent on call: 15 minutes with patient face to face via video conference. More than 50% of this time was spent in counseling and coordination of care. 10 minutes total spent in review of patient's record and preparation of their chart. I verified patient identity using two factors (patient name and date of birth). Patient consents verbally to being seen via telemedicine visit today.    DEPRESSION Reports she has been a little down.  Is at beach this week, is calm place, but is not enjoying it.  Typically she is a big fitness person and active, but has had no energy.  States in past she has used Wellbutrin, which worked well. Has taken it for short periods and often is able to come back off after stressors have improved. Mood status: uncontrolled Psychotherapy/counseling: none Previous psychiatric medications: Wellbutrin Depressed mood: yes Anxious mood: no Anhedonia: yes Significant weight loss or gain: no Insomnia: no hard to stay asleep Fatigue: yes Feelings of worthlessness or guilt: no Impaired concentration/indecisiveness: no Suicidal ideations: no Hopelessness: no  Crying spells: no Depression screen [facility] 2/9 12/17/2018 01/19/2018 12/26/2015  Decreased Interest 2 0 0  Down, Depressed, Hopeless 2 0 0  PHQ - 2 Score 4 0 0  Altered sleeping 2 1 -  Tired, decreased energy 3 0 -  Change in appetite 3 0 -  Feeling bad or failure about yourself  3 0 -  Trouble concentrating 2 0 -  Moving slowly or fidgety/restless 0 0 -  Suicidal thoughts 0 0 -  PHQ-9 Score 17 1 -  Difficult doing work/chores Somewhat difficult - -   GAD 7 : Generalized Anxiety Score 12/17/2018  Nervous, Anxious, on Edge 2  Control/stop worrying 2  Worry too much - different things 2  Trouble relaxing 3  Restless 0  Easily annoyed or irritable 2  Afraid - awful might happen 2  Total GAD 7 Score 13  Anxiety Difficulty Somewhat difficult     Relevant past medical, surgical, family and social history reviewed and updated as indicated. Interim medical history since our last visit reviewed. Allergies and medications reviewed and updated.  Review of Systems  Constitutional: Negative for activity change, appetite change, diaphoresis, fatigue and fever.  Respiratory: Negative for cough, chest tightness and shortness of breath.   Cardiovascular: Negative for chest pain, palpitations and leg swelling.  Gastrointestinal: Negative for abdominal distention, abdominal pain, constipation, diarrhea, nausea and vomiting.  Neurological: Negative for dizziness, syncope, weakness, light-headedness, numbness and headaches.  Psychiatric/Behavioral: Positive for sleep disturbance. Negative for decreased concentration, self-injury and  suicidal ideas. The patient is not nervous/anxious.     Per HPI unless specifically indicated above     Objective:    Pulse 76   Temp 98.4 F (36.9 C)   Wt 190 lb (86.2 kg)   BMI 30.03 kg/m   Wt Readings from Last 3 Encounters:  12/17/18 190 lb (86.2 kg)  01/19/18 176 lb 6.4 oz (80 kg)  11/09/17 177 lb (80.3 kg)    Physical Exam Vitals signs and nursing note  reviewed.  Constitutional:      General: She is awake. She is not in acute distress.    Appearance: She is well-developed. She is not ill-appearing.  HENT:     Head: Normocephalic.     Right Ear: Hearing normal.     Left Ear: Hearing normal.  Eyes:     General: Lids are normal.        Right eye: No discharge.        Left eye: No discharge.     Conjunctiva/sclera: Conjunctivae normal.  Neck:     Musculoskeletal: Normal range of motion.  Cardiovascular:     Comments: Unable to auscultate due to virtual exam only  Pulmonary:     Effort: Pulmonary effort is normal. No accessory muscle usage or respiratory distress.     Comments: Unable to auscultate due to virtual exam only  Neurological:     Mental Status: She is alert and oriented to person, place, and time.  Psychiatric:        Attention and Perception: Attention normal.        Mood and Affect: Mood normal.        Behavior: Behavior normal. Behavior is cooperative.        Thought Content: Thought content normal.        Judgment: Judgment normal.     Results for orders placed or performed in visit on 05/25/18  Comprehensive metabolic panel  Result Value Ref Range   Glucose 89 65 - 99 mg/dL   BUN 18 6 - 24 mg/dL   Creatinine, Ser 0.86 0.57 - 1.00 mg/dL   GFR calc non Af Amer 81 >59 mL/min/1.73   GFR calc Af Amer 93 >59 mL/min/1.73   BUN/Creatinine Ratio 21 9 - 23   Sodium 139 134 - 144 mmol/L   Potassium 4.6 3.5 - 5.2 mmol/L   Chloride 101 96 - 106 mmol/L   CO2 21 20 - 29 mmol/L   Calcium 9.7 8.7 - 10.2 mg/dL   Total Protein 6.7 6.0 - 8.5 g/dL   Albumin 4.6 3.5 - 5.5 g/dL   Globulin, Total 2.1 1.5 - 4.5 g/dL   Albumin/Globulin Ratio 2.2 1.2 - 2.2   Bilirubin Total 0.3 0.0 - 1.2 mg/dL   Alkaline Phosphatase 46 39 - 117 IU/L   AST 15 0 - 40 IU/L   ALT 12 0 - 32 IU/L      Assessment & Plan:   Problem List Items Addressed This Visit      Other   Depression, major, single episode, mild ([place])    New onset, mild  per her report.  Has had similar episodes in past and done well with Wellbutrin.  Script for Wellbutrin XR 150 MG daily sent.  To follow-up in 4 weeks and will adjust dose if needed at that time.      Relevant Medications   buPROPion (WELLBUTRIN XL) 150 MG 24 hr tablet      I discussed the assessment and treatment  plan with the patient. The patient was provided an opportunity to ask questions and all were answered. The patient agreed with the plan and demonstrated an understanding of the instructions.   The patient was advised to call back or seek an in-person evaluation if the symptoms worsen or if the condition fails to improve as anticipated.   I provided 15 minutes of time during this encounter.  Follow up plan: Return in about 4 weeks (around 01/14/2019) for Mood follow-up.

## 2018-12-17 NOTE — Assessment & Plan Note (Signed)
New onset, mild per her report.  Has had similar episodes in past and done well with Wellbutrin.  Script for Wellbutrin XR 150 MG daily sent.  To follow-up in 4 weeks and will adjust dose if needed at that time.

## 2019-01-31 ENCOUNTER — Encounter: Payer: Self-pay | Admitting: Physician Assistant

## 2019-01-31 ENCOUNTER — Telehealth: Payer: BC Managed Care – PPO | Admitting: Physician Assistant

## 2019-01-31 DIAGNOSIS — Z20828 Contact with and (suspected) exposure to other viral communicable diseases: Secondary | ICD-10-CM

## 2019-01-31 DIAGNOSIS — J029 Acute pharyngitis, unspecified: Secondary | ICD-10-CM

## 2019-01-31 DIAGNOSIS — Z20822 Contact with and (suspected) exposure to covid-19: Secondary | ICD-10-CM

## 2019-01-31 DIAGNOSIS — R059 Cough, unspecified: Secondary | ICD-10-CM

## 2019-01-31 DIAGNOSIS — R52 Pain, unspecified: Secondary | ICD-10-CM

## 2019-01-31 DIAGNOSIS — R05 Cough: Secondary | ICD-10-CM

## 2019-01-31 MED ORDER — BENZONATATE 100 MG PO CAPS: 100.0000 mg | ORAL_CAPSULE | Freq: Three times a day (TID) | ORAL | 0 refills | Status: DC | PRN

## 2019-01-31 NOTE — Progress Notes (Signed)
E-Visit for Corona Virus Screening   Your current symptoms could be consistent with the coronavirus.  Many health care providers can now test patients at their office but not all are.  [facility] has multiple testing sites. For information on our COVID testing locations and hours go to [email or web address]  Please quarantine yourself while awaiting your test results.  We are enrolling you in our [place] for Page . Daily you will receive a questionnaire within the [facility] website. Our COVID 19 response team willl be monitoring your responses daily.    COVID-19 is a respiratory illness with symptoms that are similar to the flu. Symptoms are typically mild to moderate, but there have been cases of severe illness and death due to the virus. The following symptoms may appear 2-14 days after exposure: . Fever . Cough . Shortness of breath or difficulty breathing . Chills . Repeated shaking with chills . Muscle pain . Headache . Sore throat . New loss of taste or smell . Fatigue . Congestion or runny nose . Nausea or vomiting . Diarrhea  It is vitally important that if you feel that you have an infection such as this virus or any other virus that you stay home and away from places where you may spread it to others.  You should self-quarantine for 14 days if you have symptoms that could potentially be coronavirus or have been in close contact a with a person diagnosed with COVID-19 within the last 2 weeks. You should avoid contact with people age 48 and older.   You should wear a mask or cloth face covering over your nose and mouth if you must be around other people or animals, including pets (even at home). Try to stay at least 6 feet away from other people. This will protect the people around you.  You can use medication such as A prescription cough medication called Tessalon Perles 100 mg. You may take 1-2 capsules every 8 hours as needed for  cough  You may also take acetaminophen (Tylenol) as needed for fever.  I have provided a work note     Reduce your risk of any infection by using the same precautions used for avoiding the common cold or flu:  Marland Kitchen Wash your hands often with soap and warm water for at least 20 seconds.  If soap and water are not readily available, use an alcohol-based hand sanitizer with at least 60% alcohol.  . If coughing or sneezing, cover your mouth and nose by coughing or sneezing into the elbow areas of your shirt or coat, into a tissue or into your sleeve (not your hands). . Avoid shaking hands with others and consider head nods or verbal greetings only. . Avoid touching your eyes, nose, or mouth with unwashed hands.  . Avoid close contact with people who are sick. . Avoid places or events with large numbers of people in one location, like concerts or sporting events. . Carefully consider travel plans you have or are making. . If you are planning any travel outside or inside the [place], visit the CDC's Travelers' Health webpage for the latest health notices. . If you have some symptoms but not all symptoms, continue to monitor at home and seek medical attention if your symptoms worsen. . If you are having a medical emergency, call 911.  HOME CARE . Only take medications as instructed by your medical team. . Drink plenty of fluids and get plenty of rest. . A steam or ultrasonic  humidifier can help if you have congestion.   GET HELP RIGHT AWAY IF YOU HAVE EMERGENCY WARNING SIGNS** FOR COVID-19. If you or someone is showing any of these signs seek emergency medical care immediately. Call 911 or proceed to your closest emergency facility if: . You develop worsening high fever. . Trouble breathing . Bluish lips or face . Persistent pain or pressure in the chest . New confusion . Inability to wake or stay awake . You cough up blood. . Your symptoms become more severe  **This list is not all possible  symptoms. Contact your medical provider for any symptoms that are sever or concerning to you.   MAKE SURE YOU   Understand these instructions.  Will watch your condition.  Will get help right away if you are not doing well or get worse.  Your e-visit answers were reviewed by a board certified advanced clinical practitioner to complete your personal care plan.  Depending on the condition, your plan could have included both over the counter or prescription medications.  If there is a problem please reply once you have received a response from your provider.  Your safety is important to [place].  If you have drug allergies check your prescription carefully.    You can use MyChart to ask questions about today's visit, request a non-urgent call back, or ask for a work or school excuse for 24 hours related to this e-Visit. If it has been greater than 24 hours you will need to follow up with your provider, or enter a new e-Visit to address those concerns. You will get an e-mail in the next two days asking about your experience.  I hope that your e-visit has been valuable and will speed your recovery. Thank you for using e-visits.   I spent 5-10 minutes on review and completion of this note- Lacy Duverney [facility]

## 2019-03-01 ENCOUNTER — Ambulatory Visit (INDEPENDENT_AMBULATORY_CARE_PROVIDER_SITE_OTHER): Payer: BC Managed Care – PPO | Admitting: Nurse Practitioner

## 2019-03-01 ENCOUNTER — Encounter: Payer: Self-pay | Admitting: Nurse Practitioner

## 2019-03-01 ENCOUNTER — Other Ambulatory Visit: Payer: Self-pay

## 2019-03-01 DIAGNOSIS — B309 Viral conjunctivitis, unspecified: Secondary | ICD-10-CM | POA: Diagnosis not present

## 2019-03-01 DIAGNOSIS — H109 Unspecified conjunctivitis: Secondary | ICD-10-CM | POA: Insufficient documentation

## 2019-03-01 MED ORDER — ERYTHROMYCIN 5 MG/GM OP OINT: 1.0000 "application " | TOPICAL_OINTMENT | Freq: Three times a day (TID) | OPHTHALMIC | 1 refills | Status: DC

## 2019-03-01 NOTE — Patient Instructions (Signed)

## 2019-03-01 NOTE — Assessment & Plan Note (Signed)
Suspect some underlying conjunctivitis left eye based on recent sinus presentation with Covid 19. Due to fluctuation of erythema and edema, will send in script at this time for erythromycin gtts.  Recommend warm compresses to left eye for discomfort and gentle washing with soap/water.  If ongoing or worsening issues she is to return to office.

## 2019-03-01 NOTE — Progress Notes (Signed)
Temp 98.6 F (37 C) (Oral)   Wt 200 lb (90.7 kg)   BMI 31.61 kg/m    Subjective:    Patient ID: Jaclyn West, female    DOB: 1971-01-22, 48 y.o.   MRN: [id number]  HPI: Jaclyn West is a 48 y.o. female  Chief Complaint  Patient presents with  . Eye Problem    swelling anf puffiness around left eye     . This visit was completed via Doximity due to the restrictions of the COVID-19 pandemic. All issues as above were discussed and addressed. Physical exam was done as above through visual confirmation on Doximity. If it was felt that the patient should be evaluated in the office, they were directed there. The patient verbally consented to this visit. . Location of the patient: home . Location of the provider: home . Those involved with this call:  . Provider: Marnee Guarneri, DNP . CMA: Yvonna Alanis, CMA . Front Desk/Registration: Jill Side  . Time spent on call: 15 minutes with patient face to face via video conference. More than 50% of this time was spent in counseling and coordination of care. 10 minutes total spent in review of patient's record and preparation of their chart.  . I verified patient identity using two factors (patient name and date of birth). Patient consents verbally to being seen via telemedicine visit today.    EYELID ERYTHEMA LEFT SIDE Tested positive for Covid 02/01/2019 and near end of quarantine left eye with redness and edema.  States this is fluctuating.  When Covid present she states it was all sinus in presentation.  Currently she reports no presence of mass or anything noted under lid on exam.  The erythema and edema to left eye comes and goes.  She has woken up with crusting to left eye and has noticed it is more crusted at night.  Denies any pain, change in vision, lesion, mass.   Duration:  weeks Involved eye:  left Onset: gradual Severity: no pain Foreign body sensation:no Visual impairment: no Eye redness: yes Discharge: no Crusting or  matting of eyelids: yes Swelling: yes Photophobia: no Itching: no Tearing: yes Headache: no Floaters: no URI symptoms: recent Covid 19 which was all sinus presentation Contact lens use: yes, but currently not using Close contacts with similar problems: no Eye trauma: no Status: fluctuating Treatments attempted: None  Relevant past medical, surgical, family and social history reviewed and updated as indicated. Interim medical history since our last visit reviewed. Allergies and medications reviewed and updated.  Review of Systems  Constitutional: Negative for activity change, appetite change, diaphoresis, fatigue and fever.  Eyes: Positive for discharge. Negative for photophobia, pain, redness, itching and visual disturbance.  Respiratory: Negative for cough, chest tightness and shortness of breath.   Cardiovascular: Negative for chest pain, palpitations and leg swelling.  Gastrointestinal: Negative for abdominal distention, abdominal pain, constipation, diarrhea, nausea and vomiting.  Psychiatric/Behavioral: Negative.     Per HPI unless specifically indicated above     Objective:    Temp 98.6 F (37 C) (Oral)   Wt 200 lb (90.7 kg)   BMI 31.61 kg/m   Wt Readings from Last 3 Encounters:  03/01/19 200 lb (90.7 kg)  12/17/18 190 lb (86.2 kg)  01/19/18 176 lb 6.4 oz (80 kg)    Physical Exam Vitals signs and nursing note reviewed.  Constitutional:      General: She is awake. She is not in acute distress.    Appearance: She is  well-developed. She is not ill-appearing.  HENT:     Head: Normocephalic.     Right Ear: Hearing normal.     Left Ear: Hearing normal.  Eyes:     General: Lids are normal.        Right eye: No discharge.        Left eye: No discharge.     Conjunctiva/sclera: Conjunctivae normal.     Comments: Viewed via virtual: Mild erythema noted along upper lash line left eye.  No lesions or masses noted.  No drainage noted during exam.  Conjunctivae not  injected.  Patient denies tenderness on palpation.  Right eye intact without abnormality noted.  Neck:     Musculoskeletal: Normal range of motion.  Pulmonary:     Effort: Pulmonary effort is normal. No accessory muscle usage or respiratory distress.  Neurological:     Mental Status: She is alert and oriented to person, place, and time.  Psychiatric:        Attention and Perception: Attention normal.        Mood and Affect: Mood normal.        Behavior: Behavior normal. Behavior is cooperative.        Thought Content: Thought content normal.        Judgment: Judgment normal.     Results for orders placed or performed in visit on 05/25/18  Comprehensive metabolic panel  Result Value Ref Range   Glucose 89 65 - 99 mg/dL   BUN 18 6 - 24 mg/dL   Creatinine, Ser 0.86 0.57 - 1.00 mg/dL   GFR calc non Af Amer 81 >59 mL/min/1.73   GFR calc Af Amer 93 >59 mL/min/1.73   BUN/Creatinine Ratio 21 9 - 23   Sodium 139 134 - 144 mmol/L   Potassium 4.6 3.5 - 5.2 mmol/L   Chloride 101 96 - 106 mmol/L   CO2 21 20 - 29 mmol/L   Calcium 9.7 8.7 - 10.2 mg/dL   Total Protein 6.7 6.0 - 8.5 g/dL   Albumin 4.6 3.5 - 5.5 g/dL   Globulin, Total 2.1 1.5 - 4.5 g/dL   Albumin/Globulin Ratio 2.2 1.2 - 2.2   Bilirubin Total 0.3 0.0 - 1.2 mg/dL   Alkaline Phosphatase 46 39 - 117 IU/L   AST 15 0 - 40 IU/L   ALT 12 0 - 32 IU/L      Assessment & Plan:   Problem List Items Addressed This Visit      Other   Conjunctivitis    Suspect some underlying conjunctivitis left eye based on recent sinus presentation with Covid 19. Due to fluctuation of erythema and edema, will send in script at this time for erythromycin gtts.  Recommend warm compresses to left eye for discomfort and gentle washing with soap/water.  If ongoing or worsening issues she is to return to office.          I discussed the assessment and treatment plan with the patient. The patient was provided an opportunity to ask questions and all were  answered. The patient agreed with the plan and demonstrated an understanding of the instructions.   The patient was advised to call back or seek an in-person evaluation if the symptoms worsen or if the condition fails to improve as anticipated.   I provided 15 minutes of time during this encounter.  Follow up plan: Return if symptoms worsen or fail to improve.

## 2019-03-02 ENCOUNTER — Telehealth: Payer: Self-pay | Admitting: Nurse Practitioner

## 2019-03-02 NOTE — Telephone Encounter (Signed)
Spoke to patient via telephone and discussed eye.  She reports noticing a large area to inner eye this morning with a white pinpoint spot.  Discussed with her using warm compressed to eye 3-5 times a day leaving on for 5 to 10 minutes + performing gentle massage to area.  Continue eye gtts.  Do not wear make-up or contact lenses until area improved.  She is going to reach out to her ophthalmologist to see if needed, whether they perform I&D.  Will try simple treatments first, but if no improvement she is to reach out to provider immediately and if current ophthalmologist does not perform I&D to hordeolum then will refer elsewhere.  She agrees with plan of care and will keep in touch with provider and notify if worsening noted.

## 2019-03-02 NOTE — Telephone Encounter (Signed)
Copied from [place] [id number]. Topic: General - Inquiry >> Mar 02, 2019  8:08 AM Virl Axe D wrote: Reason for CRM: Pt stated that she was treated for conjunctivitis yesterday but now her eye has developed a bump with a head and she believes she may have a staph infection. She would like to know what Jolene recommends. Please advise.

## 2019-03-24 ENCOUNTER — Other Ambulatory Visit: Payer: Self-pay | Admitting: Nurse Practitioner

## 2019-07-05 ENCOUNTER — Other Ambulatory Visit: Payer: Self-pay | Admitting: Nurse Practitioner

## 2019-07-18 ENCOUNTER — Encounter: Payer: Self-pay | Admitting: Nurse Practitioner

## 2020-08-28 ENCOUNTER — Encounter: Payer: BC Managed Care – PPO | Admitting: Nurse Practitioner

## 2020-09-22 ENCOUNTER — Encounter: Payer: Self-pay | Admitting: Nurse Practitioner

## 2020-09-22 DIAGNOSIS — E7800 Pure hypercholesterolemia, unspecified: Secondary | ICD-10-CM | POA: Insufficient documentation

## 2020-09-24 ENCOUNTER — Encounter: Payer: BC Managed Care – PPO | Admitting: Nurse Practitioner

## 2021-04-24 ENCOUNTER — Encounter: Payer: Self-pay | Admitting: Nurse Practitioner

## 2021-04-24 ENCOUNTER — Other Ambulatory Visit (HOSPITAL_COMMUNITY)
Admission: RE | Admit: 2021-04-24 | Discharge: 2021-04-24 | Disposition: A | Discharge status: Home / Self Care | Payer: BC Managed Care – PPO | Source: Ambulatory Visit | Attending: Nurse Practitioner | Admitting: Nurse Practitioner

## 2021-04-24 ENCOUNTER — Ambulatory Visit (INDEPENDENT_AMBULATORY_CARE_PROVIDER_SITE_OTHER): Payer: BC Managed Care – PPO | Admitting: Nurse Practitioner

## 2021-04-24 ENCOUNTER — Other Ambulatory Visit: Payer: Self-pay

## 2021-04-24 VITALS — BP 115/73 | HR 64 | Temp 98.1°F | Ht 67.0 in | Wt 192.2 lb

## 2021-04-24 DIAGNOSIS — Z136 Encounter for screening for cardiovascular disorders: Secondary | ICD-10-CM

## 2021-04-24 DIAGNOSIS — Z124 Encounter for screening for malignant neoplasm of cervix: Secondary | ICD-10-CM | POA: Diagnosis not present

## 2021-04-24 DIAGNOSIS — Z1211 Encounter for screening for malignant neoplasm of colon: Secondary | ICD-10-CM | POA: Diagnosis not present

## 2021-04-24 DIAGNOSIS — E7800 Pure hypercholesterolemia, unspecified: Secondary | ICD-10-CM

## 2021-04-24 DIAGNOSIS — Q51820 Cervical duplication: Secondary | ICD-10-CM

## 2021-04-24 DIAGNOSIS — Z1231 Encounter for screening mammogram for malignant neoplasm of breast: Secondary | ICD-10-CM

## 2021-04-24 DIAGNOSIS — Z0000 Encounter for general adult medical examination without abnormal findings: Secondary | ICD-10-CM

## 2021-04-24 DIAGNOSIS — Z23 Encounter for immunization: Secondary | ICD-10-CM | POA: Diagnosis not present

## 2021-04-24 NOTE — Assessment & Plan Note (Signed)
Pap obtained on both cervix today.

## 2021-04-24 NOTE — Patient Instructions (Signed)

## 2021-04-24 NOTE — Assessment & Plan Note (Signed)
Noted on past labs in 2019 -- recheck lipid panel today and continue diet /exercise focus.

## 2021-04-24 NOTE — Progress Notes (Signed)
BP 115/73   Pulse 64   Temp 98.1 F (36.7 C) (Oral)   Ht 5\' 7"  (1.702 m)   Wt 192 lb 3.2 oz (87.2 kg)   SpO2 98%   BMI 30.10 kg/m    Subjective:    Patient ID: Jaclyn West, female    DOB: June 23, 1971, 50 y.o.   MRN: [id number]  HPI: Chelsi Warr is a 50 y.o. female presenting on 04/24/2021 for comprehensive medical examination. Current medical complaints include:none  She currently lives with: husband Menopausal Symptoms: no  -- continues to have menstrual cycles, LMP two weeks ago  Depression screen [facility] 2/9 04/24/2021 12/17/2018 01/19/2018 12/26/2015  Decreased Interest 0 2 0 0  Down, Depressed, Hopeless 0 2 0 0  PHQ - 2 Score 0 4 0 0  Altered sleeping 0 2 1 -  Tired, decreased energy 0 3 0 -  Change in appetite 0 3 0 -  Feeling bad or failure about yourself  0 3 0 -  Trouble concentrating 0 2 0 -  Moving slowly or fidgety/restless 0 0 0 -  Suicidal thoughts 0 0 0 -  PHQ-9 Score 0 17 1 -  Difficult doing work/chores Not difficult at all Somewhat difficult - -    Fall Risk 12/26/2015 01/19/2018 12/17/2018 04/24/2021  Falls in the past year? No No 0 0  Was there an injury with Fall? - - 0 0  Fall Risk Category Calculator - - 0 0  Fall Risk Category - - Low Low  Patient Fall Risk Level - - Low fall risk Low fall risk  Patient at Risk for Falls Due to - - - No Fall Risks  Fall risk Follow up - - - Falls prevention discussed    Functional Status Survey: Is the patient deaf or have difficulty hearing?: No Does the patient have difficulty seeing, even when wearing glasses/contacts?: No Does the patient have difficulty concentrating, remembering, or making decisions?: No Does the patient have difficulty walking or climbing stairs?: No Does the patient have difficulty dressing or bathing?: No Does the patient have difficulty doing errands alone such as visiting a doctor's office or shopping?: No    Past Medical History:  Past Medical History:  Diagnosis Date   Acne     Depression    Double cervix    Obesity    Premenopause menorrhagia    Uterus didelphys     Surgical History:  Past Surgical History:  Procedure Laterality Date   CESAREAN SECTION     x 2   SKIN LESION EXCISION      Medications:  No current outpatient medications on file prior to visit.   No current facility-administered medications on file prior to visit.    Allergies:  Allergies  Allergen Reactions   Iodinated Diagnostic Agents Itching   Red Dye Itching    Social History:  Social History   Socioeconomic History   Marital status: Married    Spouse name: Not on file   Number of children: Not on file   Years of education: Not on file   Highest education level: Not on file  Occupational History   Not on file  Tobacco Use   Smoking status: Never   Smokeless tobacco: Never  Vaping Use   Vaping Use: Never used  Substance and Sexual Activity   Alcohol use: No    Alcohol/week: 0.0 standard drinks   Drug use: No   Sexual activity: Yes  Other Topics Concern   Not  on file  Social History Narrative   Not on file   Social Determinants of Health   Financial Resource Strain: Low Risk    Difficulty of Paying Living Expenses: Not hard at all  Food Insecurity: No Food Insecurity   Worried About Charity fundraiser in the Last Year: Never true   Arboriculturist in the Last Year: Never true  Transportation Needs: No Transportation Needs   Lack of Transportation (Medical): No   Lack of Transportation (Non-Medical): No  Physical Activity: Sufficiently Active   Days of Exercise per Week: 6 days   Minutes of Exercise per Session: 30 min  Stress: No Stress Concern Present   Feeling of Stress : Only a little  Social Connections: Moderately Integrated   Frequency of Communication with Friends and Family: More than three times a week   Frequency of Social Gatherings with Friends and Family: More than three times a week   Attends Religious Services: More than 4 times per  year   Active Member of [employer] or Organizations: No   Attends Music therapist: Never   Marital Status: Married  Human resources officer Violence: Not At Risk   Fear of Current or Ex-Partner: No   Emotionally Abused: No   Physically Abused: No   Sexually Abused: No   Social History   Tobacco Use  Smoking Status Never  Smokeless Tobacco Never   Social History   Substance and Sexual Activity  Alcohol Use No   Alcohol/week: 0.0 standard drinks    Family History:  Family History  Problem Relation Age of Onset   Hypothyroidism Mother    Hypertension Mother    Arthritis Mother    Kidney cancer Mother    Heart disease Father        MI   Heart disease Brother    Heart disease Brother    Anemia Daughter    Asthma Son    Heart disease Maternal Grandmother        CHF   Stroke Maternal Grandfather    Heart disease Paternal Grandfather     Past medical history, surgical history, medications, allergies, family history and social history reviewed with patient today and changes made to appropriate areas of the chart.   ROS All other ROS negative except what is listed above and in the HPI.      Objective:    BP 115/73   Pulse 64   Temp 98.1 F (36.7 C) (Oral)   Ht 5\' 7"  (1.702 m)   Wt 192 lb 3.2 oz (87.2 kg)   SpO2 98%   BMI 30.10 kg/m   Wt Readings from Last 3 Encounters:  04/24/21 192 lb 3.2 oz (87.2 kg)  03/01/19 200 lb (90.7 kg)  12/17/18 190 lb (86.2 kg)    Physical Exam Vitals and nursing note reviewed. Exam conducted with a chaperone present.  Constitutional:      General: She is awake. She is not in acute distress.    Appearance: She is well-developed. She is not ill-appearing.  HENT:     Head: Normocephalic and atraumatic.     Right Ear: Hearing, tympanic membrane, ear canal and external ear normal. No drainage.     Left Ear: Hearing, tympanic membrane, ear canal and external ear normal. No drainage.     Nose: Nose normal.     Right Sinus: No  maxillary sinus tenderness or frontal sinus tenderness.     Left Sinus: No maxillary sinus tenderness or  frontal sinus tenderness.     Mouth/Throat:     Mouth: Mucous membranes are moist.     Pharynx: Oropharynx is clear. Uvula midline. No pharyngeal swelling, oropharyngeal exudate or posterior oropharyngeal erythema.  Eyes:     General: Lids are normal.        Right eye: No discharge.        Left eye: No discharge.     Extraocular Movements: Extraocular movements intact.     Conjunctiva/sclera: Conjunctivae normal.     Pupils: Pupils are equal, round, and reactive to light.     Visual Fields: Right eye visual fields normal and left eye visual fields normal.  Neck:     Thyroid: No thyromegaly.     Vascular: No carotid bruit.     Trachea: Trachea normal.  Cardiovascular:     Rate and Rhythm: Normal rate and regular rhythm.     Heart sounds: Normal heart sounds. No murmur heard.   No gallop.  Pulmonary:     Effort: Pulmonary effort is normal. No accessory muscle usage or respiratory distress.     Breath sounds: Normal breath sounds.  Chest:  Breasts:    Right: Normal.     Left: Normal.  Abdominal:     General: Bowel sounds are normal.     Palpations: Abdomen is soft. There is no hepatomegaly or splenomegaly.     Tenderness: There is no abdominal tenderness.     Hernia: There is no hernia in the left inguinal area or right inguinal area.  Genitourinary:    Exam position: Lithotomy position.     Labia:        Right: No rash.        Left: No rash.      Vagina: Normal.     Cervix: Normal.     Uterus: Normal.      Adnexa: Right adnexa normal and left adnexa normal.     Comments: Double cervix -- #1 anterior and viewed with pap sample obtained and # 2 more posterior to left viewed and pap sample obtained.  Double uterus noted and palpated. Musculoskeletal:        General: Normal range of motion.     Cervical back: Normal range of motion and neck supple.     Right lower leg: No  edema.     Left lower leg: No edema.  Lymphadenopathy:     Head:     Right side of head: No submental, submandibular, tonsillar, preauricular or posterior auricular adenopathy.     Left side of head: No submental, submandibular, tonsillar, preauricular or posterior auricular adenopathy.     Cervical: No cervical adenopathy.     Upper Body:     Right upper body: No supraclavicular, axillary or pectoral adenopathy.     Left upper body: No supraclavicular, axillary or pectoral adenopathy.  Skin:    General: Skin is warm and dry.     Capillary Refill: Capillary refill takes less than 2 seconds.     Findings: No rash.  Neurological:     Mental Status: She is alert and oriented to person, place, and time.     Gait: Gait is intact.     Deep Tendon Reflexes: Reflexes are normal and symmetric.     Reflex Scores:      Brachioradialis reflexes are 2+ on the right side and 2+ on the left side.      Patellar reflexes are 2+ on the right side and 2+ on the  left side. Psychiatric:        Attention and Perception: Attention normal.        Mood and Affect: Mood normal.        Speech: Speech normal.        Behavior: Behavior normal. Behavior is cooperative.        Thought Content: Thought content normal.        Judgment: Judgment normal.   Results for orders placed or performed in visit on 05/25/18  Comprehensive metabolic panel  Result Value Ref Range   Glucose 89 65 - 99 mg/dL   BUN 18 6 - 24 mg/dL   Creatinine, Ser 0.86 0.57 - 1.00 mg/dL   GFR calc non Af Amer 81 >59 mL/min/1.73   GFR calc Af Amer 93 >59 mL/min/1.73   BUN/Creatinine Ratio 21 9 - 23   Sodium 139 134 - 144 mmol/L   Potassium 4.6 3.5 - 5.2 mmol/L   Chloride 101 96 - 106 mmol/L   CO2 21 20 - 29 mmol/L   Calcium 9.7 8.7 - 10.2 mg/dL   Total Protein 6.7 6.0 - 8.5 g/dL   Albumin 4.6 3.5 - 5.5 g/dL   Globulin, Total 2.1 1.5 - 4.5 g/dL   Albumin/Globulin Ratio 2.2 1.2 - 2.2   Bilirubin Total 0.3 0.0 - 1.2 mg/dL   Alkaline  Phosphatase 46 39 - 117 IU/L   AST 15 0 - 40 IU/L   ALT 12 0 - 32 IU/L      Assessment & Plan:   Problem List Items Addressed This Visit       Genitourinary   Double cervix    Pap obtained on both cervix today.      Relevant Orders   Cytology - PAP   Cytology - PAP     Other   Elevated low density lipoprotein (LDL) cholesterol level - Primary    Noted on past labs in 2019 -- recheck lipid panel today and continue diet /exercise focus.      Relevant Orders   Lipid Panel w/o Chol/HDL Ratio   Other Visit Diagnoses     Colon cancer screening       GI referral in place   Relevant Orders   Ambulatory referral to Gastroenterology   Encounter for screening mammogram for malignant neoplasm of breast       Mammogram ordered   Relevant Orders   MM 3D SCREEN BREAST BILATERAL   Cervical cancer screening       Pap obtained to both cervix today.   Relevant Orders   Cytology - PAP   Cytology - PAP   Need for Tdap vaccination       Tdap provided today in office   Relevant Orders   Tdap vaccine greater than or equal to 7yo IM (Completed)   Encounter for screening for cardiovascular disorders       Lipid panel today   Relevant Orders   Comprehensive metabolic panel   Lipid Panel w/o Chol/HDL Ratio   Encounter for annual physical exam       Annual physical with labs performed today, health maintenance reviewed.   Relevant Orders   CBC with Differential/Platelet   TSH        Follow up plan: Return in about 1 year (around 04/24/2022) for Annual physical.   LABORATORY TESTING:  - Pap smear: pap done  IMMUNIZATIONS:   - Tdap: Tetanus vaccination status reviewed: last tetanus booster within 10 years, Td vaccination indicated and given  today. - Influenza: Up to date - Pneumovax: Not applicable - Prevnar: Not applicable - HPV: Not applicable - Zostavax vaccine: Refused  SCREENING: -Mammogram: Ordered today  - Colonoscopy: Ordered today  - Bone Density: Not  applicable  -Hearing Test: Not applicable  -Spirometry: Not applicable   PATIENT COUNSELING:   Advised to take 1 mg of folate supplement per day if capable of pregnancy.   Sexuality: Discussed sexually transmitted diseases, partner selection, use of condoms, avoidance of unintended pregnancy  and contraceptive alternatives.   Advised to avoid cigarette smoking.  I discussed with the patient that most people either abstain from alcohol or drink within safe limits (<=14/week and <=4 drinks/occasion for males, <=7/weeks and <= 3 drinks/occasion for females) and that the risk for alcohol disorders and other health effects rises proportionally with the number of drinks per week and how often a drinker exceeds daily limits.  Discussed cessation/primary prevention of drug use and availability of treatment for abuse.   Diet: Encouraged to adjust caloric intake to maintain  or achieve ideal body weight, to reduce intake of dietary saturated fat and total fat, to limit sodium intake by avoiding high sodium foods and not adding table salt, and to maintain adequate dietary potassium and calcium preferably from fresh fruits, vegetables, and low-fat dairy products.    Stressed the importance of regular exercise  Injury prevention: Discussed safety belts, safety helmets, smoke detector, smoking near bedding or upholstery.   Dental health: Discussed importance of regular tooth brushing, flossing, and dental visits.    NEXT PREVENTATIVE PHYSICAL DUE IN 1 YEAR. Return in about 1 year (around 04/24/2022) for Annual physical.

## 2021-04-25 LAB — CBC WITH DIFFERENTIAL/PLATELET
Basophils Absolute: 0.1 10*3/uL (ref 0.0–0.2)
Basos: 1 %
EOS (ABSOLUTE): 0.1 10*3/uL (ref 0.0–0.4)
Eos: 1 %
Hematocrit: 42.2 % (ref 34.0–46.6)
Hemoglobin: 13.7 g/dL (ref 11.1–15.9)
Immature Grans (Abs): 0 10*3/uL (ref 0.0–0.1)
Immature Granulocytes: 0 %
Lymphocytes Absolute: 3.2 10*3/uL — ABNORMAL HIGH (ref 0.7–3.1)
Lymphs: 38 %
MCH: 31 pg (ref 26.6–33.0)
MCHC: 32.5 g/dL (ref 31.5–35.7)
MCV: 96 fL (ref 79–97)
Monocytes Absolute: 0.8 10*3/uL (ref 0.1–0.9)
Monocytes: 9 %
Neutrophils Absolute: 4.3 10*3/uL (ref 1.4–7.0)
Neutrophils: 51 %
Platelets: 296 10*3/uL (ref 150–450)
RBC: 4.42 x10E6/uL (ref 3.77–5.28)
RDW: 11.6 % — ABNORMAL LOW (ref 11.7–15.4)
WBC: 8.5 10*3/uL (ref 3.4–10.8)

## 2021-04-25 LAB — LIPID PANEL W/O CHOL/HDL RATIO
Cholesterol, Total: 203 mg/dL — ABNORMAL HIGH (ref 100–199)
HDL: 57 mg/dL (ref >39)
LDL Chol Calc (NIH): 137 mg/dL — ABNORMAL HIGH (ref 0–99)
Triglycerides: 50 mg/dL (ref 0–149)
VLDL Cholesterol Cal: 9 mg/dL (ref 5–40)

## 2021-04-25 LAB — COMPREHENSIVE METABOLIC PANEL
ALT: 26 IU/L (ref 0–32)
AST: 23 IU/L (ref 0–40)
Albumin/Globulin Ratio: 2.1 (ref 1.2–2.2)
Albumin: 4.6 g/dL (ref 3.8–4.8)
Alkaline Phosphatase: 63 IU/L (ref 44–121)
BUN/Creatinine Ratio: 21 (ref 9–23)
BUN: 16 mg/dL (ref 6–24)
Bilirubin Total: 0.2 mg/dL (ref 0.0–1.2)
CO2: 23 mmol/L (ref 20–29)
Calcium: 9.6 mg/dL (ref 8.7–10.2)
Chloride: 99 mmol/L (ref 96–106)
Creatinine, Ser: 0.75 mg/dL (ref 0.57–1.00)
Globulin, Total: 2.2 g/dL (ref 1.5–4.5)
Glucose: 77 mg/dL (ref 70–99)
Potassium: 4.2 mmol/L (ref 3.5–5.2)
Sodium: 138 mmol/L (ref 134–144)
Total Protein: 6.8 g/dL (ref 6.0–8.5)
eGFR: 97 mL/min/{1.73_m2} (ref >59)

## 2021-04-25 LAB — TSH: TSH: 1.81 u[IU]/mL (ref 0.450–4.500)

## 2021-04-25 NOTE — Progress Notes (Signed)
Contacted via MyChart The 10-year ASCVD risk score (Arnett DK, et al., 2019) is: 1%   Values used to calculate the score:     Age: 50 years     Sex: Female     Is Non-Hispanic African American: No     Diabetic: No     Tobacco smoker: No     Systolic Blood Pressure: 561 mmHg     Is BP treated: No     HDL Cholesterol: 57 mg/dL     Total Cholesterol: 203 mg/dL    Good morning Drake, your blood work has returned and overall looks great with exception of cholesterol levels.  Your cholesterol is still high, but continued recommendations to make lifestyle changes. Your LDL is above normal. The LDL is the bad cholesterol. Over time and in combination with inflammation and other factors, this contributes to plaque which in turn may lead to stroke and/or heart attack down the road. Sometimes high LDL is primarily genetic, and people might be eating all the right foods but still have high numbers. Other times, there is room for improvement in one's diet and eating healthier can bring this number down and potentially reduce one's risk of heart attack and/or stroke.   To reduce your LDL, Remember - more fruits and vegetables, more fish, and limit red meat and dairy products. More soy, nuts, beans, barley, lentils, oats and plant sterol ester enriched margarine instead of butter. I also encourage eliminating sugar and processed food. Remember, shop on the outside of the grocery store and visit your [employer]. If you would like to talk with me about dietary changes for your cholesterol, please let me know. We should recheck your cholesterol in 12 months.  Any questions? Keep being awesome!!  Thank you for allowing me to participate in your care.  I appreciate you. Kindest regards, Tonianne Fine

## 2021-04-30 LAB — CYTOLOGY - PAP
Adequacy: ABSENT
Adequacy: ABSENT
Comment: NEGATIVE
Comment: NEGATIVE
Diagnosis: NEGATIVE
Diagnosis: NEGATIVE
High risk HPV: NEGATIVE
High risk HPV: NEGATIVE

## 2021-04-30 NOTE — Progress Notes (Signed)
Contacted via [place] morning Trenity, I have one pap back so far.  This is negative.  Great news!! Keep being amazing!!  Thank you for allowing me to participate in your care.  I appreciate you. Kindest regards, Oseas Detty

## 2021-05-29 ENCOUNTER — Encounter: Payer: Self-pay | Admitting: *Deleted

## 2021-05-29 ENCOUNTER — Ambulatory Visit
Admission: RE | Admit: 2021-05-29 | Discharge: 2021-05-29 | Disposition: A | Discharge status: Home / Self Care | Payer: BC Managed Care – PPO | Source: Ambulatory Visit | Attending: Nurse Practitioner | Admitting: Nurse Practitioner

## 2021-05-29 DIAGNOSIS — Z1231 Encounter for screening mammogram for malignant neoplasm of breast: Secondary | ICD-10-CM

## 2021-05-30 NOTE — Telephone Encounter (Signed)
This encounter was created in error - please disregard.

## 2021-05-30 NOTE — Progress Notes (Signed)
Contacted via MyChart   Normal mammogram, repeat in one year!!:)

## 2021-09-06 ENCOUNTER — Other Ambulatory Visit: Payer: Self-pay

## 2021-09-06 ENCOUNTER — Ambulatory Visit
Admission: RE | Admit: 2021-09-06 | Discharge: 2021-09-06 | Disposition: A | Discharge status: Home / Self Care | Payer: BC Managed Care – PPO | Source: Ambulatory Visit | Attending: Emergency Medicine | Admitting: Emergency Medicine

## 2021-09-06 ENCOUNTER — Ambulatory Visit (INDEPENDENT_AMBULATORY_CARE_PROVIDER_SITE_OTHER)
Admission: RE | Admit: 2021-09-06 | Discharge: 2021-09-06 | Disposition: A | Discharge status: Home / Self Care | Payer: BC Managed Care – PPO | Source: Ambulatory Visit | Attending: Emergency Medicine | Admitting: Emergency Medicine

## 2021-09-06 VITALS — BP 122/76 | HR 61 | Temp 98.2°F | Resp 16

## 2021-09-06 DIAGNOSIS — S8012XA Contusion of left lower leg, initial encounter: Secondary | ICD-10-CM

## 2021-09-06 DIAGNOSIS — M79662 Pain in left lower leg: Secondary | ICD-10-CM

## 2021-09-06 NOTE — ED Triage Notes (Signed)
Patient presents to Urgent Care with complaints of left leg injury x 2 weeks ago.  Pt states she was running and believes to have a muscle knot, she states for the past two weeks she has been massaging the area with no improvement. Today she noted some bruising to leg and now has concerns for possible DVT. ? ?Denies SOB or chest pain.   ?

## 2021-09-06 NOTE — Discharge Instructions (Addendum)
Your ultrasound today did not demonstrate any deep vein thrombosis or muscular abnormality. ? ?You have most likely strained or contused your calf as result of your training. ? ?Make sure that you are stretching well before physical activity and after physical activity. ? ?Wear compression socks, hose, or compression tights to help support your calf and protect the muscle from further injury. ? ?You can apply moist heat to your calf for 20 minutes at a time 2-3 times a day to help stimulate blood flow and improve wound healing. ? ?Use over-the-counter Tylenol and ibuprofen according to the package instructions as needed for pain. ? ?Return for reevaluation for new or worsening symptoms. ?

## 2021-09-06 NOTE — ED Provider Notes (Signed)
[facility]    CSN: [id number] Arrival date & time: 09/06/21  1142      History   Chief Complaint Chief Complaint  Patient presents with   Leg Injury    Left leg     HPI Jaclyn West is a 51 y.o. female.   HPI  51 year old female here for evaluation of left calf pain.  Patient reports that she developed a knot on the upper and outer aspect of her left calf 2 weeks ago after running.  She states that in the ensuing interval she has been massaging it and she also got a special massage 2 weeks ago without any improvement of the muscle knot.  She states that the area is bruised but she attributes that to her massaging the area.  There is no other swelling of her calf, numbness or tingling of her toes, she does not have any history of blood clots, she is not a smoker, she does not take OCPs or HRT.  She denies any recent long trips by air or car.  No history of cancer.  She came here to be evaluated because her brother had similar symptoms and he had a DVT.  She denies any shortness of breath or chest pain.  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      Home Medications    Prior to Admission medications   Not on File    Family History Family History  Problem Relation Age of Onset   Hypothyroidism Mother    Hypertension Mother    Arthritis Mother    Kidney cancer Mother    Heart disease Father        MI   Heart disease Brother    Heart disease Brother    Anemia Daughter    Asthma Son    Heart disease  Maternal Grandmother        CHF   Stroke Maternal Grandfather    Heart disease Paternal Grandfather     Social History Social History   Tobacco Use   Smoking status: Never   Smokeless tobacco: Never  Vaping Use   Vaping Use: Never used  Substance Use Topics   Alcohol use: No    Alcohol/week: 0.0 standard drinks   Drug use: No     Allergies   Iodinated contrast media and Red dye   Review of Systems Review of Systems  Respiratory:  Negative for shortness of breath.   Cardiovascular:  Negative for chest pain.  Musculoskeletal:  Positive for myalgias. Negative for arthralgias.  Hematological: Negative.   Psychiatric/Behavioral: Negative.      Physical Exam Triage Vital Signs ED Triage Vitals  Enc Vitals Group     BP 09/06/21 1157 122/76     Pulse Rate 09/06/21 1157 61     Resp 09/06/21 1157 16     Temp 09/06/21 1157 98.2 F (36.8 C)  Temp Source 09/06/21 1157 Oral     SpO2 09/06/21 1157 99 %     Weight --      Height --      Head Circumference --      Peak Flow --      Pain Score 09/06/21 1156 3     Pain Loc --      Pain Edu? --      Excl. in [place]? --    No data found.  Updated Vital Signs BP 122/76 (BP Location: Left Arm)    Pulse 61    Temp 98.2 F (36.8 C) (Oral)    Resp 16    LMP 08/20/2021 (Approximate)    SpO2 99%   Visual Acuity Right Eye Distance:   Left Eye Distance:   Bilateral Distance:    Right Eye Near:   Left Eye Near:    Bilateral Near:     Physical Exam Vitals and nursing note reviewed.  Constitutional:      Appearance: Normal appearance.  Musculoskeletal:        General: Tenderness present. No swelling, deformity or signs of injury. Normal range of motion.  Skin:    General: Skin is warm and dry.     Capillary Refill: Capillary refill takes less than 2 seconds.     Findings: Bruising present. No erythema.  Neurological:     General: No focal deficit present.     Mental Status: She is alert and oriented to person, place,  and time.     Sensory: No sensory deficit.  Psychiatric:        Mood and Affect: Mood normal.        Behavior: Behavior normal.        Thought Content: Thought content normal.        Judgment: Judgment normal.     UC Treatments / Results  Labs (all labs ordered are listed, but only abnormal results are displayed) Labs Reviewed - No data to display  EKG   Radiology US Venous Img Lower Unilateral Left (DVT)  Result Date: 09/06/2021 CLINICAL DATA:  Pain left calf x2 weeks EXAM: LEFT LOWER EXTREMITY VENOUS DOPPLER ULTRASOUND TECHNIQUE: Gray-scale sonography with graded compression, as well as color Doppler and duplex ultrasound were performed to evaluate the lower extremity deep venous systems from the level of the common femoral vein and including the common femoral, femoral, profunda femoral, popliteal and calf veins including the posterior tibial, peroneal and gastrocnemius veins when visible. The superficial great saphenous vein was also interrogated. Spectral Doppler was utilized to evaluate flow at rest and with distal augmentation maneuvers in the common femoral, femoral and popliteal veins. COMPARISON:  None. FINDINGS: Contralateral Common Femoral Vein: Respiratory phasicity is normal and symmetric with the symptomatic side. No evidence of thrombus. Normal compressibility. Common Femoral Vein: No evidence of thrombus. Normal compressibility, respiratory phasicity and response to augmentation. Saphenofemoral Junction: No evidence of thrombus. Normal compressibility and flow on color Doppler imaging. Profunda Femoral Vein: No evidence of thrombus. Normal compressibility and flow on color Doppler imaging. Femoral Vein: No evidence of thrombus. Normal compressibility, respiratory phasicity and response to augmentation. Popliteal Vein: No evidence of thrombus. Normal compressibility, respiratory phasicity and response to augmentation. Calf Veins: No evidence of thrombus. Normal compressibility  and flow on color Doppler imaging. Other Findings: Additional focused sonographic imaging of the soft tissues in the area of interest demonstrates no abnormality. IMPRESSION: No evidence of deep venous thrombosis. Electronically Signed   By: Murrell Redden  El-Abd M.D.   On: 09/06/2021 12:52    Procedures Procedures (including critical care time)  Medications Ordered in UC Medications - No data to display  Initial Impression / Assessment and Plan / UC Course  I have reviewed the triage vital signs and the nursing notes.  Pertinent labs & imaging results that were available during my care of the patient were reviewed by me and considered in my medical decision making (see chart for details).  Patient is a very pleasant, nontoxic-appearing 51 year old female here for evaluation of a knot on the superior lateral aspect of her left calf that is been there for the last 2 weeks.  Patient has not had any chest pain, shortness of breath, or swelling to her lower extremity.  She does admit to bruising over the "knot" that she has attributed to her massaging the area.  Her physical exam reveals symmetrical calves bilaterally.  Both calves measure 45 cm in circumference.  There is a palpable firm aspect to the superior lateral aspect of the left gastrocnemius.  This is tender to palpation.  Patient does report that with forceful dorsiflexion of her left foot she does have pain in the area where the muscle knot is.  Her DP and PT pulses are 2+.  She does not have any pain when palpating the remaining body of the calf muscle.  We will obtain ultrasound of left lower extremity to rule out the presence of a DVT.  Left lower extremity ultrasound does not reveal the presence of any DVT per radiology impression.  We will discharge patient home with diagnosis of calf contusion and have her treated conservatively with moist heat, compression, ibuprofen, and home physical therapy.  Final Clinical Impressions(s) / UC Diagnoses    Final diagnoses:  Pain of left calf  Contusion of left calf, initial encounter     Discharge Instructions      Your ultrasound today did not demonstrate any deep vein thrombosis or muscular abnormality.  You have most likely strained or contused your calf as result of your training.  Make sure that you are stretching well before physical activity and after physical activity.  Wear compression socks, hose, or compression tights to help support your calf and protect the muscle from further injury.  You can apply moist heat to your calf for 20 minutes at a time 2-3 times a day to help stimulate blood flow and improve wound healing.  Use over-the-counter Tylenol and ibuprofen according to the package instructions as needed for pain.  Return for reevaluation for new or worsening symptoms.     ED Prescriptions   None    PDMP not reviewed this encounter.   Margarette Canada, NP 09/06/21 1304

## 2021-12-30 ENCOUNTER — Encounter: Payer: Self-pay | Admitting: Emergency Medicine

## 2021-12-30 ENCOUNTER — Ambulatory Visit
Admission: EM | Admit: 2021-12-30 | Discharge: 2021-12-30 | Disposition: A | Discharge status: Home / Self Care | Payer: BC Managed Care – PPO

## 2021-12-30 DIAGNOSIS — B349 Viral infection, unspecified: Secondary | ICD-10-CM

## 2021-12-30 NOTE — ED Triage Notes (Signed)
Pt presents with fever, HA and bodyaches that started yesterday. She also has a bump behind her right thigh she is concerned about.

## 2021-12-30 NOTE — ED Provider Notes (Signed)
[facility]    CSN: [id number] Arrival date & time: 12/30/21  1723      History   Chief Complaint Chief Complaint  Patient presents with   Fever    Entered by patient   Headache    HPI MARTIN SMEAL is a 51 y.o. female.   HPI Patient presents for evaluation of fever, headache, generalized body aches which developed today.  She reports yesterday feeling fatigue and generally unwell however did not have fever.  She checked her temperature this morning and had a fever greater than 100.  She did take 1 dose of ibuprofen around 1 PM.  She currently has a low-grade temp of 99.6.  She has no associated URI symptoms.  She was concerned that she may have an infection from a bump on her left upper thigh.  She is unable to see it to examine it therefore she is unaware of anything has been draining or if it appears infected. Denies any known sick contacts, SOB or chest pain. 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      Home Medications    Prior to Admission medications   Not on File    Family History Family History  Problem Relation Age of Onset   Hypothyroidism Mother    Hypertension Mother    Arthritis Mother    Kidney cancer Mother    Heart disease Father        MI   Heart disease Brother    Heart disease Brother    Anemia Daughter    Asthma Son    Heart disease Maternal Grandmother        CHF   Stroke Maternal Grandfather    Heart disease Paternal Grandfather     Social History Social History   Tobacco Use    Smoking status: Never   Smokeless tobacco: Never  Vaping Use   Vaping Use: Never used  Substance Use Topics   Alcohol use: No    Alcohol/week: 0.0 standard drinks of alcohol   Drug use: No     Allergies   Iodinated contrast media and Red dye   Review of Systems Review of Systems   Physical Exam Triage Vital Signs ED Triage Vitals  Enc Vitals Group     BP 12/30/21 1744 138/90     Pulse Rate 12/30/21 1744 86     Resp 12/30/21 1744 16     Temp 12/30/21 1744 99.6 F (37.6 C)     Temp Source 12/30/21 1744 Oral     SpO2 12/30/21 1744 97 %     Weight --      Height --      Head Circumference --      Peak Flow --      Pain Score 12/30/21 1748 0     Pain Loc --      Pain Edu? --  Excl. in [place]? --    No data found.  Updated Vital Signs BP 138/90 (BP Location: Left Arm)   Pulse 86   Temp 99.6 F (37.6 C) (Oral)   Resp 16   LMP 12/28/2021   SpO2 97%   Visual Acuity Right Eye Distance:   Left Eye Distance:   Bilateral Distance:    Right Eye Near:   Left Eye Near:    Bilateral Near:     Physical Exam Constitutional: Patient appears ill, well developed. No distress. HENT: Normocephalic, atraumatic, External right and left ear normal. Eyes: Conjunctivae and EOM are normal. PERRLA, no scleral icterus. CVS: RRR, S1/S2 +, no murmurs, no gallops, no carotid bruit.  Pulmonary: Effort and breath sounds normal, no stridor, rhonchi, wheezes, rales.  Neuro: Alert. Normal reflexes, muscle tone coordination. No cranial nerve deficit. Skin: Skin is warm and dry.  Papule, nonerythematous on the left posterior thigh. Not diaphoretic. No erythema. No pallor. Psychiatric: Normal mood and affect. Behavior, judgment, thought content normal.   UC Treatments / Results  Labs (all labs ordered are listed, but only abnormal results are displayed) Labs Reviewed - No data to display  EKG   Radiology No results found.  Procedures Procedures (including critical care  time)  Medications Ordered in UC Medications - No data to display  Initial Impression / Assessment and Plan / UC Course  I have reviewed the triage vital signs and the nursing notes.  Pertinent labs & imaging results that were available during my care of the patient were reviewed by me and considered in my medical decision making (see chart for details).    Viral illness, patient declined PCR testing of COVID/FLU Advised to continue to alternate Tylenol and ibuprofen.  Viral illnesses are typically self-limiting and will resolve with rest and management of symptoms.  Recommended home COVID testing daily until symptoms resolve.  Return if symptoms worsen.  Go immediately to the ER if any red flag symptoms develop. Final Clinical Impressions(s) / UC Diagnoses   Final diagnoses:  Viral illness   Discharge Instructions   None    ED Prescriptions   None    PDMP not reviewed this encounter.   Scot Jun, FNP 12/30/21 [id number]

## 2022-03-19 IMAGING — MG MM DIGITAL SCREENING BILAT W/ TOMO AND CAD
6 of 10 series · 6 of 30 positions shown · non-contrast
Comparison: Previous exam(s).

CLINICAL DATA: Screening.

EXAM:
DIGITAL SCREENING BILATERAL MAMMOGRAM WITH TOMOSYNTHESIS AND CAD
TECHNIQUE: Bilateral screening digital craniocaudal and mediolateral oblique
mammograms were obtained. Bilateral screening digital breast
tomosynthesis was performed. The images were evaluated with
computer-aided detection.

[L CC synth-2D]
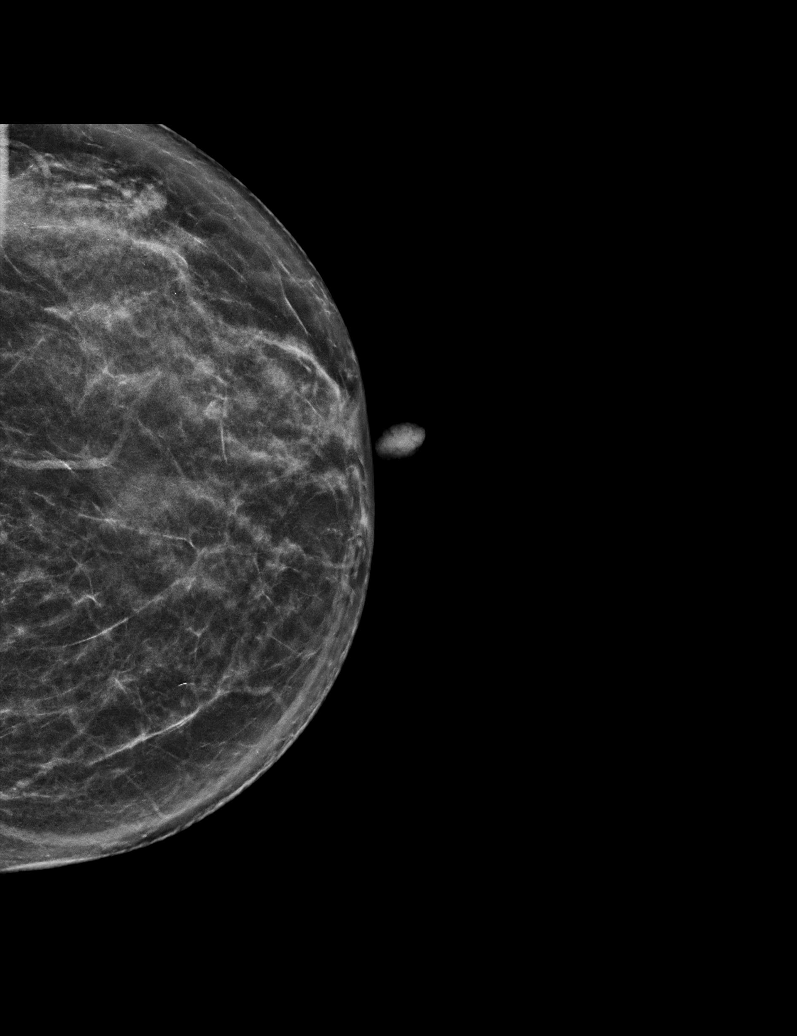

[R MLO synth-2D]
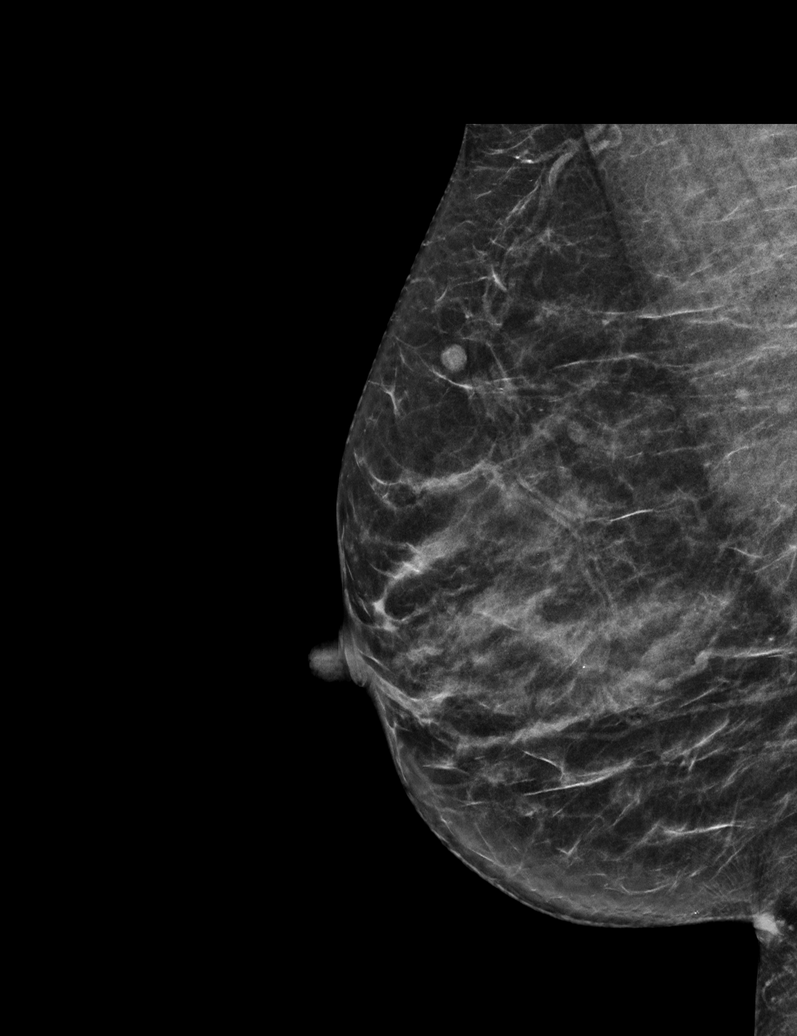

[L MLO synth-2D]
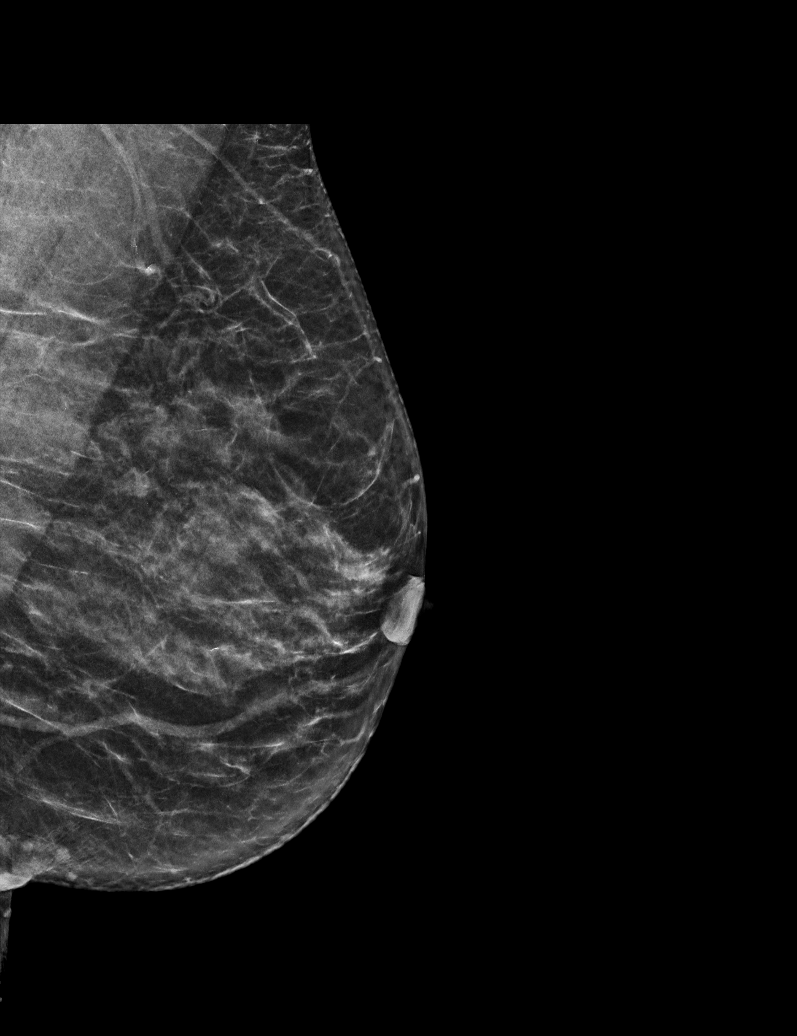

[R CC synth-2D]
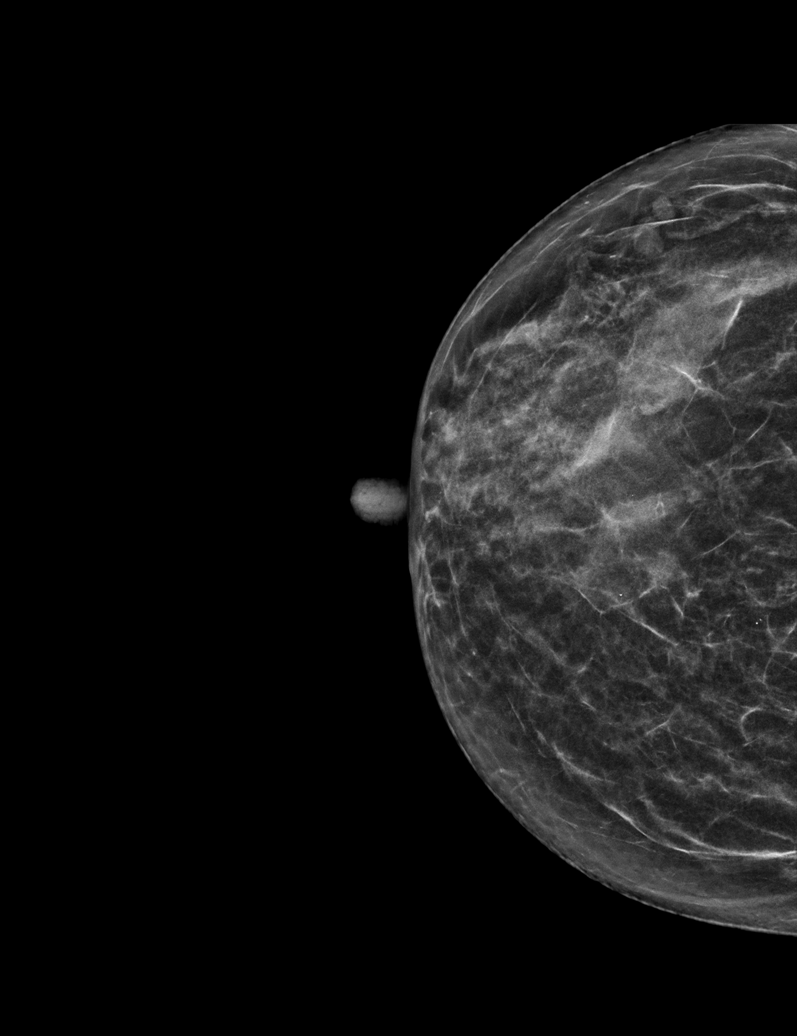

[L XCCL synth-2D]
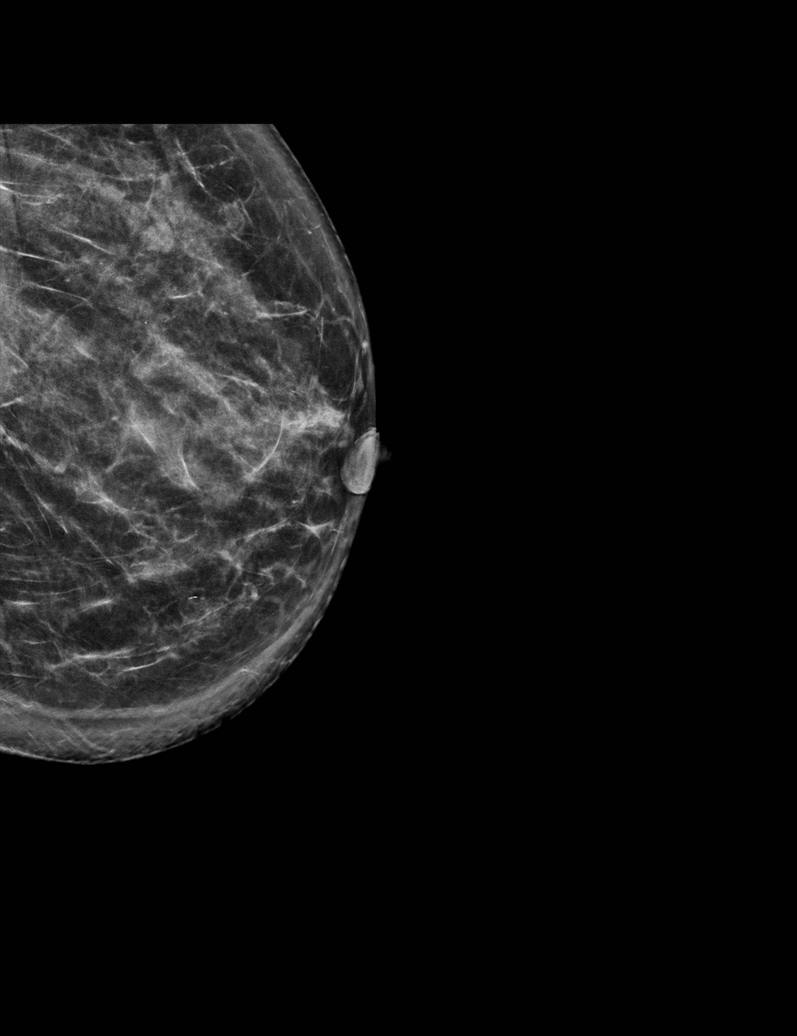

[R CC tomo · tomo slice 25/50.0]
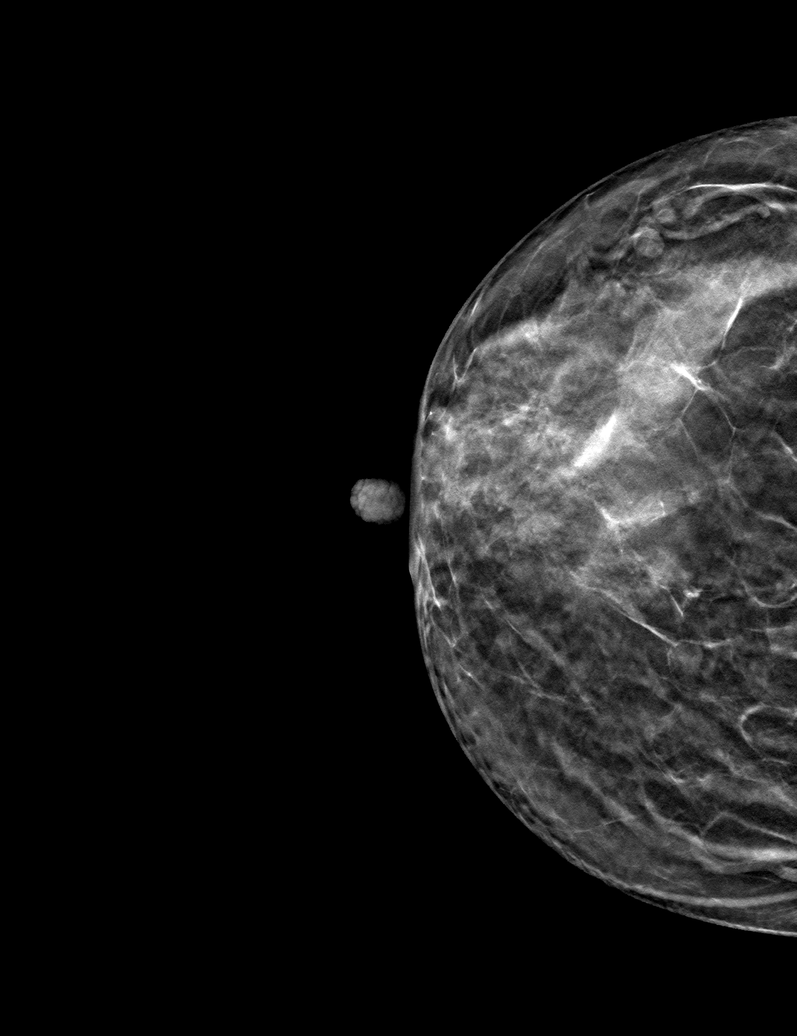

[6 of 30 positions shown; findings below may reference images not displayed]

ACR Breast Density Category c: The breast tissue is heterogeneously
dense, which may obscure small masses.
FINDINGS: There are no findings suspicious for malignancy.
IMPRESSION: No mammographic evidence of malignancy. A result letter of this
screening mammogram will be mailed directly to the patient.

RECOMMENDATION:
Screening mammogram in one year. (Code:Q3-W-BC3)

BI-RADS CATEGORY  1: Negative.

## 2022-04-15 ENCOUNTER — Other Ambulatory Visit: Payer: Self-pay | Admitting: Nurse Practitioner

## 2022-04-15 DIAGNOSIS — Z1231 Encounter for screening mammogram for malignant neoplasm of breast: Secondary | ICD-10-CM

## 2022-04-25 ENCOUNTER — Encounter: Payer: BC Managed Care – PPO | Admitting: Nurse Practitioner

## 2022-04-28 ENCOUNTER — Ambulatory Visit: Payer: BC Managed Care – PPO | Admitting: Nurse Practitioner

## 2022-05-04 NOTE — Patient Instructions (Signed)

## 2022-05-06 ENCOUNTER — Ambulatory Visit (INDEPENDENT_AMBULATORY_CARE_PROVIDER_SITE_OTHER): Payer: BC Managed Care – PPO | Admitting: Nurse Practitioner

## 2022-05-06 ENCOUNTER — Encounter: Payer: Self-pay | Admitting: Nurse Practitioner

## 2022-05-06 VITALS — BP 105/69 | HR 55 | Temp 97.9°F | Ht 66.5 in | Wt 185.1 lb

## 2022-05-06 DIAGNOSIS — E559 Vitamin D deficiency, unspecified: Secondary | ICD-10-CM

## 2022-05-06 DIAGNOSIS — Z0000 Encounter for general adult medical examination without abnormal findings: Secondary | ICD-10-CM

## 2022-05-06 DIAGNOSIS — Z114 Encounter for screening for human immunodeficiency virus [HIV]: Secondary | ICD-10-CM | POA: Diagnosis not present

## 2022-05-06 DIAGNOSIS — Z1159 Encounter for screening for other viral diseases: Secondary | ICD-10-CM

## 2022-05-06 DIAGNOSIS — Z23 Encounter for immunization: Secondary | ICD-10-CM

## 2022-05-06 DIAGNOSIS — E7800 Pure hypercholesterolemia, unspecified: Secondary | ICD-10-CM

## 2022-05-06 DIAGNOSIS — K429 Umbilical hernia without obstruction or gangrene: Secondary | ICD-10-CM | POA: Diagnosis not present

## 2022-05-06 DIAGNOSIS — Z1231 Encounter for screening mammogram for malignant neoplasm of breast: Secondary | ICD-10-CM

## 2022-05-06 DIAGNOSIS — Z1211 Encounter for screening for malignant neoplasm of colon: Secondary | ICD-10-CM

## 2022-05-06 NOTE — Progress Notes (Signed)
BP 105/69   Pulse (!) 55   Temp 97.9 F (36.6 C) (Oral)   Ht 5' 6.5" (1.689 m)   Wt 185 lb 1.6 oz (84 kg)   SpO2 97%   BMI 29.43 kg/m    Subjective:    Patient ID: Jaclyn West, female    DOB: June 06, 1971, 51 y.o.   MRN: [id number]  HPI: Jaclyn West is a 51 y.o. female presenting on 05/06/2022 for comprehensive medical examination. Current medical complaints include:none  She currently lives with: husband Menopausal Symptoms: no  -- continues to have menstrual cycles, LMP two weeks ago  The 10-year ASCVD risk score (Arnett DK, et al., 2019) is: 0.9%   Values used to calculate the score:     Age: 74 years     Sex: Female     Is Non-Hispanic African American: No     Diabetic: No     Tobacco smoker: No     Systolic Blood Pressure: 790 mmHg     Is BP treated: No     HDL Cholesterol: 57 mg/dL     Total Cholesterol: 203 mg/dL      05/06/2022    3:31 PM 04/24/2021    3:40 PM 12/17/2018    9:54 AM 01/19/2018   10:05 AM 12/26/2015   10:03 AM  Depression screen PHQ 2/9  Decreased Interest 0 0 2 0 0  Down, Depressed, Hopeless 0 0 2 0 0  PHQ - 2 Score 0 0 4 0 0  Altered sleeping 1 0 2 1   Tired, decreased energy 0 0 3 0   Change in appetite 0 0 3 0   Feeling bad or failure about yourself  0 0 3 0   Trouble concentrating 0 0 2 0   Moving slowly or fidgety/restless 0 0 0 0   Suicidal thoughts 0 0 0 0   PHQ-9 Score 1 0 17 1   Difficult doing work/chores Not difficult at all Not difficult at all Somewhat difficult         04/24/2021    3:52 PM 09/06/2021   11:57 AM 12/30/2021    5:49 PM 05/06/2022    3:31 PM 05/06/2022    3:48 PM  Fall Risk  Falls in the past year? 0   0 0  Was there an injury with Fall? 0   0 0  Fall Risk Category Calculator 0   0 0  Fall Risk Category Low   Low Low  Patient Fall Risk Level _0   Patient at Risk for Falls Due to No Fall Risks   No Fall Risks No Fall Risks  Fall risk  Follow up Falls prevention discussed   Falls evaluation completed Falls prevention discussed    Functional Status Survey: Is the patient deaf or have difficulty hearing?: No Does the patient have difficulty seeing, even when wearing glasses/contacts?: No Does the patient have difficulty concentrating, remembering, or making decisions?: No Does the patient have difficulty walking or climbing stairs?: No Does the patient have difficulty dressing or bathing?: No Does the patient have difficulty doing errands alone such as visiting a doctor's office or shopping?: No    Past Medical History:  Past Medical History:  Diagnosis Date   Acne    Depression    Double cervix    Obesity    Premenopause menorrhagia    Uterus didelphys  Surgical History:  Past Surgical History:  Procedure Laterality Date   CESAREAN SECTION     x 2   SKIN LESION EXCISION      Medications:  Current Outpatient Medications on File Prior to Visit  Medication Sig   Coenzyme Q10 200 MG capsule Take 200 mg by mouth daily.   CREATINE PO Take by mouth.   Melatonin 10 MG TABS Take by mouth.   Omega-3 Fatty Acids (OMEGA-3 PO) Take by mouth.   TART CHERRY PO Take by mouth.   Turmeric (QC TUMERIC COMPLEX PO) Take by mouth.   VITAMIN D PO Take by mouth.   No current facility-administered medications on file prior to visit.    Allergies:  Allergies  Allergen Reactions   Iodinated Contrast Media Itching   Red Dye Itching    Social History:  Social History   Socioeconomic History   Marital status: Married    Spouse name: Not on file   Number of children: Not on file   Years of education: Not on file   Highest education level: Not on file  Occupational History   Not on file  Tobacco Use   Smoking status: Never   Smokeless tobacco: Never  Vaping Use   Vaping Use: Never used  Substance and Sexual Activity   Alcohol use: No    Alcohol/week: 0.0 standard drinks of alcohol   Drug use: No   Sexual  activity: Yes  Other Topics Concern   Not on file  Social History Narrative   Not on file   Social Determinants of Health   Financial Resource Strain: Low Risk  (04/24/2021)   Overall Financial Resource Strain (CARDIA)    Difficulty of Paying Living Expenses: Not hard at all  Food Insecurity: No Food Insecurity (04/24/2021)   Hunger Vital Sign    Worried About Running Out of Food in the Last Year: Never true    [place] in the Last Year: Never true  Transportation Needs: No Transportation Needs (04/24/2021)   PRAPARE - Hydrologist (Medical): No    Lack of Transportation (Non-Medical): No  Physical Activity: Sufficiently Active (04/24/2021)   Exercise Vital Sign    Days of Exercise per Week: 6 days    Minutes of Exercise per Session: 30 min  Stress: No Stress Concern Present (04/24/2021)   [place]    Feeling of Stress : Only a little  Social Connections: Moderately Integrated (04/24/2021)   Social Connection and Isolation Panel [NHANES]    Frequency of Communication with Friends and Family: More than three times a week    Frequency of Social Gatherings with Friends and Family: More than three times a week    Attends Religious Services: More than 4 times per year    Active Member of [employer] or Organizations: No    Attends Archivist Meetings: Never    Marital Status: Married  Human resources officer Violence: Not At Risk (04/24/2021)   Humiliation, Afraid, Rape, and Kick questionnaire    Fear of Current or Ex-Partner: No    Emotionally Abused: No    Physically Abused: No    Sexually Abused: No   Social History   Tobacco Use  Smoking Status Never  Smokeless Tobacco Never   Social History   Substance and Sexual Activity  Alcohol Use No   Alcohol/week: 0.0 standard drinks of alcohol    Family History:  Family History  Problem Relation Age of Onset    Hypothyroidism Mother    Hypertension Mother    Arthritis Mother    Kidney cancer Mother    Heart disease Father        MI   Heart disease Brother    Brain cancer Brother    Heart disease Brother    Anemia Daughter    Asthma Son    Heart disease Maternal Grandmother        CHF   Stroke Maternal Grandfather    Heart disease Paternal Grandfather     Past medical history, surgical history, medications, allergies, family history and social history reviewed with patient today and changes made to appropriate areas of the chart.   ROS All other ROS negative except what is listed above and in the HPI.      Objective:    BP 105/69   Pulse (!) 55   Temp 97.9 F (36.6 C) (Oral)   Ht 5' 6.5" (1.689 m)   Wt 185 lb 1.6 oz (84 kg)   SpO2 97%   BMI 29.43 kg/m   Wt Readings from Last 3 Encounters:  05/06/22 185 lb 1.6 oz (84 kg)  04/24/21 192 lb 3.2 oz (87.2 kg)  03/01/19 200 lb (90.7 kg)    Physical Exam Vitals and nursing note reviewed.  Constitutional:      General: She is awake. She is not in acute distress.    Appearance: She is well-developed. She is not ill-appearing.  HENT:     Head: Normocephalic and atraumatic.     Right Ear: Hearing, tympanic membrane, ear canal and external ear normal. No drainage.     Left Ear: Hearing, tympanic membrane, ear canal and external ear normal. No drainage.     Nose: Nose normal.     Right Sinus: No maxillary sinus tenderness or frontal sinus tenderness.     Left Sinus: No maxillary sinus tenderness or frontal sinus tenderness.     Mouth/Throat:     Mouth: Mucous membranes are moist.     Pharynx: Oropharynx is clear. Uvula midline. No pharyngeal swelling, oropharyngeal exudate or posterior oropharyngeal erythema.  Eyes:     General: Lids are normal.        Right eye: No discharge.        Left eye: No discharge.     Extraocular Movements: Extraocular movements intact.     Conjunctiva/sclera: Conjunctivae normal.     Pupils: Pupils  are equal, round, and reactive to light.     Visual Fields: Right eye visual fields normal and left eye visual fields normal.  Neck:     Thyroid: No thyromegaly.     Vascular: No carotid bruit.     Trachea: Trachea normal.  Cardiovascular:     Rate and Rhythm: Normal rate and regular rhythm.     Heart sounds: Normal heart sounds. No murmur heard.    No gallop.  Pulmonary:     Effort: Pulmonary effort is normal. No accessory muscle usage or respiratory distress.     Breath sounds: Normal breath sounds.  Chest:  Breasts:    Right: Normal.     Left: Normal.  Abdominal:     General: Bowel sounds are normal.     Palpations: Abdomen is soft. There is no hepatomegaly or splenomegaly.     Tenderness: There is no abdominal tenderness.     Hernia: A hernia is present. Hernia is present in the umbilical area.  Musculoskeletal:  General: Normal range of motion.     Cervical back: Normal range of motion and neck supple.     Right lower leg: No edema.     Left lower leg: No edema.  Lymphadenopathy:     Head:     Right side of head: No submental, submandibular, tonsillar, preauricular or posterior auricular adenopathy.     Left side of head: No submental, submandibular, tonsillar, preauricular or posterior auricular adenopathy.     Cervical: No cervical adenopathy.     Upper Body:     Right upper body: No supraclavicular, axillary or pectoral adenopathy.     Left upper body: No supraclavicular, axillary or pectoral adenopathy.  Skin:    General: Skin is warm and dry.     Capillary Refill: Capillary refill takes less than 2 seconds.     Findings: No rash.  Neurological:     Mental Status: She is alert and oriented to person, place, and time.     Gait: Gait is intact.     Deep Tendon Reflexes: Reflexes are normal and symmetric.     Reflex Scores:      Brachioradialis reflexes are 2+ on the right side and 2+ on the left side.      Patellar reflexes are 2+ on the right side and 2+ on  the left side. Psychiatric:        Attention and Perception: Attention normal.        Mood and Affect: Mood normal.        Speech: Speech normal.        Behavior: Behavior normal. Behavior is cooperative.        Thought Content: Thought content normal.        Judgment: Judgment normal.    Results for orders placed or performed in visit on 04/24/21  CBC with Differential/Platelet  Result Value Ref Range   WBC 8.5 3.4 - 10.8 x10E3/uL   RBC 4.42 3.77 - 5.28 x10E6/uL   Hemoglobin 13.7 11.1 - 15.9 g/dL   Hematocrit 42.2 34.0 - 46.6 %   MCV 96 79 - 97 fL   MCH 31.0 26.6 - 33.0 pg   MCHC 32.5 31.5 - 35.7 g/dL   RDW 11.6 (L) 11.7 - 15.4 %   Platelets 296 150 - 450 x10E3/uL   Neutrophils 51 Not Estab. %   Lymphs 38 Not Estab. %   Monocytes 9 Not Estab. %   Eos 1 Not Estab. %   Basos 1 Not Estab. %   Neutrophils Absolute 4.3 1.4 - 7.0 x10E3/uL   Lymphocytes Absolute 3.2 (H) 0.7 - 3.1 x10E3/uL   Monocytes Absolute 0.8 0.1 - 0.9 x10E3/uL   EOS (ABSOLUTE) 0.1 0.0 - 0.4 x10E3/uL   Basophils Absolute 0.1 0.0 - 0.2 x10E3/uL   Immature Granulocytes 0 Not Estab. %   Immature Grans (Abs) 0.0 0.0 - 0.1 x10E3/uL  Comprehensive metabolic panel  Result Value Ref Range   Glucose 77 70 - 99 mg/dL   BUN 16 6 - 24 mg/dL   Creatinine, Ser 0.75 0.57 - 1.00 mg/dL   eGFR 97 >59 mL/min/1.73   BUN/Creatinine Ratio 21 9 - 23   Sodium 138 134 - 144 mmol/L   Potassium 4.2 3.5 - 5.2 mmol/L   Chloride 99 96 - 106 mmol/L   CO2 23 20 - 29 mmol/L   Calcium 9.6 8.7 - 10.2 mg/dL   Total Protein 6.8 6.0 - 8.5 g/dL   Albumin 4.6 3.8 - 4.8 g/dL  Globulin, Total 2.2 1.5 - 4.5 g/dL   Albumin/Globulin Ratio 2.1 1.2 - 2.2   Bilirubin Total 0.2 0.0 - 1.2 mg/dL   Alkaline Phosphatase 63 44 - 121 IU/L   AST 23 0 - 40 IU/L   ALT 26 0 - 32 IU/L  Lipid Panel w/o Chol/HDL Ratio  Result Value Ref Range   Cholesterol, Total 203 (H) 100 - 199 mg/dL   Triglycerides 50 0 - 149 mg/dL   HDL 57 >39 mg/dL   VLDL  Cholesterol Cal 9 5 - 40 mg/dL   LDL Chol Calc (NIH) 137 (H) 0 - 99 mg/dL  TSH  Result Value Ref Range   TSH 1.810 0.450 - 4.500 uIU/mL  Cytology - PAP  Result Value Ref Range   High risk HPV Negative    Adequacy      Satisfactory for evaluation; transformation zone component ABSENT.   Diagnosis      - Negative for intraepithelial lesion or malignancy (NILM)   Comment Normal Reference Range HPV - Negative   Cytology - PAP  Result Value Ref Range   High risk HPV Negative    Adequacy      Satisfactory for evaluation; transformation zone component ABSENT.   Diagnosis      - Negative for intraepithelial lesion or malignancy (NILM)   Comment Normal Reference Range HPV - Negative       Assessment & Plan:   Problem List Items Addressed This Visit       Other   Elevated low density lipoprotein (LDL) cholesterol level - Primary (Chronic)    Noted on past labs with ASCVD <1% -- recheck lipid panel today and continue diet /exercise focus.      Relevant Orders   Comprehensive metabolic panel   Lipid Panel w/o Chol/HDL Ratio   Umbilical hernia    Small and no symptoms, continue to monitor and if any symptoms present send to general surgery.      Other Visit Diagnoses     Vitamin D deficiency       History of low levels reported, check today and initiate supplement as needed.   Relevant Orders   VITAMIN D 25 Hydroxy (Vit-D Deficiency, Fractures)   Encounter for screening for HIV       HIV screen on labs today per guidelines for one time screening, discussed with patient.   Relevant Orders   HIV Antibody (routine testing w rflx)   Need for hepatitis C screening test       Hep C screen on labs today per guidelines for one time screening, discussed with patient.   Relevant Orders   Hepatitis C antibody   Colon cancer screening       GI referral placed   Relevant Orders   Ambulatory referral to Gastroenterology   Encounter for screening mammogram for malignant neoplasm of  breast       Mammogram ordered today and scheduled 06/03/22.   Relevant Orders   MM 3D SCREEN BREAST BILATERAL   Encounter for annual physical exam       Annual physical today with labs and health maintenance reviewed, discussed with patient.   Relevant Orders   CBC with Differential/Platelet   TSH        Follow up plan: Return in about 1 year (around 05/07/2023) for Annual physical.   LABORATORY TESTING:  - Pap smear: pap done  IMMUNIZATIONS:   - Tdap: Tetanus vaccination status reviewed: last tetanus booster within 10 years, Td vaccination  indicated and given today. - Influenza: Up to date - Pneumovax: Not applicable - Prevnar: Not applicable - HPV: Not applicable - Zostavax vaccine: Refused -- wishes to wait  SCREENING: -Mammogram: Scheduled for 06/03/22 - Colonoscopy: Ordered today  - Bone Density: Not applicable  -Hearing Test: Not applicable  -Spirometry: Not applicable   PATIENT COUNSELING:   Advised to take 1 mg of folate supplement per day if capable of pregnancy.   Sexuality: Discussed sexually transmitted diseases, partner selection, use of condoms, avoidance of unintended pregnancy  and contraceptive alternatives.   Advised to avoid cigarette smoking.  I discussed with the patient that most people either abstain from alcohol or drink within safe limits (<=14/week and <=4 drinks/occasion for males, <=7/weeks and <= 3 drinks/occasion for females) and that the risk for alcohol disorders and other health effects rises proportionally with the number of drinks per week and how often a drinker exceeds daily limits.  Discussed cessation/primary prevention of drug use and availability of treatment for abuse.   Diet: Encouraged to adjust caloric intake to maintain  or achieve ideal body weight, to reduce intake of dietary saturated fat and total fat, to limit sodium intake by avoiding high sodium foods and not adding table salt, and to maintain adequate dietary potassium  and calcium preferably from fresh fruits, vegetables, and low-fat dairy products.    Stressed the importance of regular exercise  Injury prevention: Discussed safety belts, safety helmets, smoke detector, smoking near bedding or upholstery.   Dental health: Discussed importance of regular tooth brushing, flossing, and dental visits.    NEXT PREVENTATIVE PHYSICAL DUE IN 1 YEAR. Return in about 1 year (around 05/07/2023) for Annual physical.

## 2022-05-06 NOTE — Assessment & Plan Note (Signed)
Noted on past labs with ASCVD <1% -- recheck lipid panel today and continue diet /exercise focus.

## 2022-05-06 NOTE — Assessment & Plan Note (Signed)
Small and no symptoms, continue to monitor and if any symptoms present send to general surgery.

## 2022-05-07 ENCOUNTER — Other Ambulatory Visit: Payer: Self-pay | Admitting: *Deleted

## 2022-05-07 ENCOUNTER — Telehealth: Payer: Self-pay | Admitting: *Deleted

## 2022-05-07 DIAGNOSIS — Z1211 Encounter for screening for malignant neoplasm of colon: Secondary | ICD-10-CM

## 2022-05-07 LAB — CBC WITH DIFFERENTIAL/PLATELET
Basophils Absolute: 0.1 10*3/uL (ref 0.0–0.2)
Basos: 1 %
EOS (ABSOLUTE): 0.2 10*3/uL (ref 0.0–0.4)
Eos: 2 %
Hematocrit: 39.2 % (ref 34.0–46.6)
Hemoglobin: 13.2 g/dL (ref 11.1–15.9)
Immature Grans (Abs): 0 10*3/uL (ref 0.0–0.1)
Immature Granulocytes: 0 %
Lymphocytes Absolute: 3.5 10*3/uL — ABNORMAL HIGH (ref 0.7–3.1)
Lymphs: 44 %
MCH: 32.3 pg (ref 26.6–33.0)
MCHC: 33.7 g/dL (ref 31.5–35.7)
MCV: 96 fL (ref 79–97)
Monocytes Absolute: 0.5 10*3/uL (ref 0.1–0.9)
Monocytes: 7 %
Neutrophils Absolute: 3.7 10*3/uL (ref 1.4–7.0)
Neutrophils: 46 %
Platelets: 266 10*3/uL (ref 150–450)
RBC: 4.09 x10E6/uL (ref 3.77–5.28)
RDW: 11.3 % — ABNORMAL LOW (ref 11.7–15.4)
WBC: 7.9 10*3/uL (ref 3.4–10.8)

## 2022-05-07 LAB — COMPREHENSIVE METABOLIC PANEL
ALT: 23 IU/L (ref 0–32)
AST: 24 IU/L (ref 0–40)
Albumin/Globulin Ratio: 2.1 (ref 1.2–2.2)
Albumin: 4.4 g/dL (ref 3.8–4.9)
Alkaline Phosphatase: 51 IU/L (ref 44–121)
BUN/Creatinine Ratio: 26 — ABNORMAL HIGH (ref 9–23)
BUN: 27 mg/dL — ABNORMAL HIGH (ref 6–24)
Bilirubin Total: 0.3 mg/dL (ref 0.0–1.2)
CO2: 25 mmol/L (ref 20–29)
Calcium: 9.7 mg/dL (ref 8.7–10.2)
Chloride: 99 mmol/L (ref 96–106)
Creatinine, Ser: 1.05 mg/dL — ABNORMAL HIGH (ref 0.57–1.00)
Globulin, Total: 2.1 g/dL (ref 1.5–4.5)
Glucose: 84 mg/dL (ref 70–99)
Potassium: 4.2 mmol/L (ref 3.5–5.2)
Sodium: 138 mmol/L (ref 134–144)
Total Protein: 6.5 g/dL (ref 6.0–8.5)
eGFR: 64 mL/min/{1.73_m2} (ref >59)

## 2022-05-07 LAB — LIPID PANEL W/O CHOL/HDL RATIO
Cholesterol, Total: 201 mg/dL — ABNORMAL HIGH (ref 100–199)
HDL: 64 mg/dL (ref >39)
LDL Chol Calc (NIH): 129 mg/dL — ABNORMAL HIGH (ref 0–99)
Triglycerides: 45 mg/dL (ref 0–149)
VLDL Cholesterol Cal: 8 mg/dL (ref 5–40)

## 2022-05-07 LAB — TSH: TSH: 2.61 u[IU]/mL (ref 0.450–4.500)

## 2022-05-07 LAB — HEPATITIS C ANTIBODY: Hep C Virus Ab: NONREACTIVE

## 2022-05-07 LAB — HIV ANTIBODY (ROUTINE TESTING W REFLEX): HIV Screen 4th Generation wRfx: NONREACTIVE

## 2022-05-07 LAB — VITAMIN D 25 HYDROXY (VIT D DEFICIENCY, FRACTURES): Vit D, 25-Hydroxy: 40.2 ng/mL (ref 30.0–100.0)

## 2022-05-07 MED ORDER — NA SULFATE-K SULFATE-MG SULF 17.5-3.13-1.6 GM/177ML PO SOLN: 1.0000 | Freq: Once | ORAL | 0 refills | Status: AC

## 2022-05-07 NOTE — Progress Notes (Signed)
Contacted via MyChart The 10-year ASCVD risk score (Arnett DK, et al., 2019) is: 0.8%   Values used to calculate the score:     Age: 51 years     Sex: Female     Is Non-Hispanic African American: No     Diabetic: No     Tobacco smoker: No     Systolic Blood Pressure: 142 mmHg     Is BP treated: No     HDL Cholesterol: 64 mg/dL     Total Cholesterol: 201 mg/dL   Good evening Jaclyn West, your labs have returned and overall these remain stable and at baseline for you.  Lymphocytes, a white blood cell, is mildly elevated as on previous labs -- we will continue to monitor, no changes at this time. Kidney function, creatinine and eGFR, remains stable, as is liver function, AST and ALT. Cholesterol levels remain elevated, but no medication needed.  Continue diet and exercise focus.  Any questions? Keep being amazing!!  Thank you for allowing me to participate in your care.  I appreciate you. Kindest regards, Lorena Benham

## 2022-05-07 NOTE — Telephone Encounter (Signed)
Gastroenterology Pre-Procedure Review  Request Date: 07/17/2022 Requesting Physician: Dr. Marius Ditch  PATIENT REVIEW QUESTIONS: The patient responded to the following health history questions as indicated:    1. Are you having any GI issues? no 2. Do you have a personal history of Polyps? no 3. Do you have a family history of Colon Cancer or Polyps? yes (on dad's side grandmother had colon cancer, mother had colon polyps) 4. Diabetes Mellitus? no 5. Joint replacements in the past 12 months?no 6. Major health problems in the past 3 months?no 7. Any artificial heart valves, MVP, or defibrillator?no    MEDICATIONS & ALLERGIES:    Patient reports the following regarding taking any anticoagulation/antiplatelet therapy:   Plavix, Coumadin, Eliquis, Xarelto, Lovenox, Pradaxa, Brilinta, or Effient? no Aspirin? no  Patient confirms/reports the following medications:  Current Outpatient Medications  Medication Sig Dispense Refill   Coenzyme Q10 200 MG capsule Take 200 mg by mouth daily.     CREATINE PO Take by mouth.     Melatonin 10 MG TABS Take by mouth.     Omega-3 Fatty Acids (OMEGA-3 PO) Take by mouth.     TART CHERRY PO Take by mouth.     Turmeric (QC TUMERIC COMPLEX PO) Take by mouth.     VITAMIN D PO Take by mouth.     No current facility-administered medications for this visit.    Patient confirms/reports the following allergies:  Allergies  Allergen Reactions   Iodinated Contrast Media Itching   Red Dye Itching    No orders of the defined types were placed in this encounter.   AUTHORIZATION INFORMATION Primary Insurance: 1D#: Group #:  Secondary Insurance: 1D#: Group #:  SCHEDULE INFORMATION: Date: 07/17/2022 Time: Location: [facility]

## 2022-05-08 ENCOUNTER — Encounter: Payer: Self-pay | Admitting: Nurse Practitioner

## 2022-06-03 ENCOUNTER — Ambulatory Visit
Admission: RE | Admit: 2022-06-03 | Discharge: 2022-06-03 | Disposition: A | Discharge status: Home / Self Care | Payer: BC Managed Care – PPO | Source: Ambulatory Visit | Attending: Nurse Practitioner | Admitting: Nurse Practitioner

## 2022-06-03 DIAGNOSIS — Z1231 Encounter for screening mammogram for malignant neoplasm of breast: Secondary | ICD-10-CM

## 2022-06-04 NOTE — Progress Notes (Signed)
Contacted via MyChart   Normal mammogram, may repeat in one year:)

## 2022-06-17 ENCOUNTER — Encounter: Payer: Self-pay | Admitting: Nurse Practitioner

## 2022-06-22 NOTE — Patient Instructions (Incomplete)
Urinary Incontinence Urinary incontinence refers to a condition in which a person is unable to control where and when to pass urine. A person with this condition will urinate involuntarily. This means that the person urinates when he or she does not mean to. What are the causes? This condition may be caused by: Medicines. Infections. Constipation. Overactive bladder muscles. Weak bladder muscles. Weak pelvic floor muscles. These muscles provide support for the bladder, intestine, and, in women, the uterus. Enlarged prostate in men. The prostate is a gland near the bladder. When it gets too big, it can pinch the urethra. With the urethra blocked, the bladder can weaken and lose the ability to empty properly. Surgery. Emotional factors, such as anxiety, stress, or post-traumatic stress disorder (PTSD). Spinal cord injury, nerve injury, or other neurological conditions. Pelvic organ prolapse. This happens in women when organs move out of place and into the vagina. This movement can prevent the bladder and urethra from working properly. What increases the risk? The following factors may make you more likely to develop this condition: Age. The older you are, the higher the risk. Obesity. Being physically inactive. Pregnancy and childbirth. Menopause. Diseases that affect the nerves or spinal cord. Long-term, or chronic, coughing. This can increase pressure on the bladder and pelvic floor muscles. What are the signs or symptoms? Symptoms may vary depending on the type of urinary incontinence you have. They include: A sudden urge to urinate, and passing urine involuntarily before you can get to a bathroom (urge incontinence). Suddenly passing urine when doing activities that force urine to pass, such as coughing, laughing, exercising, or sneezing (stress incontinence). Needing to urinate often but urinating only a small amount, or constantly dribbling urine (overflow incontinence). Urinating  because you cannot get to the bathroom in time due to a physical disability, such as arthritis or injury, or due to a communication or thinking problem, such as Alzheimer's disease (functional incontinence). How is this diagnosed? This condition may be diagnosed based on: Your medical history. A physical exam. Tests, such as: Urine tests. X-rays of your kidney and bladder. Ultrasound. CT scan. Cystoscopy. In this procedure, a health care provider inserts a tube with a light and camera (cystoscope) through the urethra and into the bladder to check for problems. Urodynamic testing. These tests assess how well the bladder, urethra, and sphincter can store and release urine. There are different types of urodynamic tests, and they vary depending on what the test is measuring. To help diagnose your condition, your health care provider may recommend that you keep a log of when you urinate and how much you urinate. How is this treated? Treatment for this condition depends on the type of incontinence that you have and its cause. Treatment may include: Lifestyle changes, such as: Quitting smoking. Maintaining a healthy weight. Staying active. Try to get 150 minutes of moderate-intensity exercise every week. Ask your health care provider which activities are safe for you. Eating a healthy diet. Avoid high-fat foods, like fried foods. Avoid refined carbohydrates like white bread and white rice. Limit how much alcohol and caffeine you drink. Increase your fiber intake. Healthy sources of fiber include beans, whole grains, and fresh fruits and vegetables. Behavioral changes, such as: Pelvic floor muscle exercises. Bladder training, such as lengthening the amount of time between bathroom breaks, or using the bathroom at regular intervals. Using techniques to suppress bladder urges. This can include distraction techniques or controlled breathing exercises. Medicines, such as: Medicines to relax the  bladder   muscles and prevent bladder spasms. Medicines to help slow or prevent the growth of a man's prostate. Botox injections. These can help relax the bladder muscles. Treatments, such as: Using pulses of electricity to help change bladder reflexes (electrical nerve stimulation). For women, using a medical device to prevent urine leaks. This is a small, tampon-like, disposable device that is inserted into the urethra. Injecting collagen or carbon beads (bulking agents) into the urinary sphincter. These can help thicken tissue and close the bladder opening. Surgery. Follow these instructions at home: Lifestyle Limit alcohol and caffeine. These can fill your bladder quickly and irritate it. Keep yourself clean to help prevent odors and skin damage. Ask your health care provider about special skin creams and cleansers that can protect the skin from urine. Consider wearing pads or adult diapers. Make sure to change them regularly, and always change them right after experiencing incontinence. General instructions Take over-the-counter and prescription medicines only as told by your health care provider. Use the bathroom about every 3-4 hours, even if you do not feel the need to urinate. Try to empty your bladder completely every time. After urinating, wait a minute. Then try to urinate again. Make sure you are in a relaxed position while urinating. If your incontinence is caused by nerve problems, keep a log of the medicines you take and the times you go to the bathroom. Keep all follow-up visits. This is important. Where to find more information National Institute of Diabetes and Digestive and Kidney Diseases: www.niddk.nih.gov American Urology Association: www.urologyhealth.org Contact a health care provider if: You have pain that gets worse. Your incontinence gets worse. Get help right away if: You have a fever or chills. You are unable to urinate. You have redness in your groin area or  down your legs. Summary Urinary incontinence refers to a condition in which a person is unable to control where and when to pass urine. This condition may be caused by medicines, infection, weak bladder muscles, weak pelvic floor muscles, enlargement of the prostate (in men), or surgery. Factors such as older age, obesity, pregnancy and childbirth, menopause, neurological diseases, and chronic coughing may increase your risk for developing this condition. Types of urinary incontinence include urge incontinence, stress incontinence, overflow incontinence, and functional incontinence. This condition is usually treated first with lifestyle and behavioral changes, such as quitting smoking, eating a healthier diet, and doing regular pelvic floor exercises. Other treatment options include medicines, bulking agents, medical devices, electrical nerve stimulation, or surgery. This information is not intended to replace advice given to you by your health care provider. Make sure you discuss any questions you have with your health care provider. Document Revised: 01/20/2020 Document Reviewed: 01/20/2020 Elsevier Patient Education  2023 [employer].  

## 2022-06-24 ENCOUNTER — Ambulatory Visit: Payer: BC Managed Care – PPO | Admitting: Nurse Practitioner

## 2022-06-24 ENCOUNTER — Encounter: Payer: Self-pay | Admitting: Nurse Practitioner

## 2022-06-24 DIAGNOSIS — N393 Stress incontinence (female) (male): Secondary | ICD-10-CM

## 2022-06-30 NOTE — Patient Instructions (Signed)
Urinary Incontinence Urinary incontinence refers to a condition in which a person is unable to control where and when to pass urine. A person with this condition will urinate involuntarily. This means that the person urinates when he or she does not mean to. What are the causes? This condition may be caused by: Medicines. Infections. Constipation. Overactive bladder muscles. Weak bladder muscles. Weak pelvic floor muscles. These muscles provide support for the bladder, intestine, and, in women, the uterus. Enlarged prostate in men. The prostate is a gland near the bladder. When it gets too big, it can pinch the urethra. With the urethra blocked, the bladder can weaken and lose the ability to empty properly. Surgery. Emotional factors, such as anxiety, stress, or post-traumatic stress disorder (PTSD). Spinal cord injury, nerve injury, or other neurological conditions. Pelvic organ prolapse. This happens in women when organs move out of place and into the vagina. This movement can prevent the bladder and urethra from working properly. What increases the risk? The following factors may make you more likely to develop this condition: Age. The older you are, the higher the risk. Obesity. Being physically inactive. Pregnancy and childbirth. Menopause. Diseases that affect the nerves or spinal cord. Long-term, or chronic, coughing. This can increase pressure on the bladder and pelvic floor muscles. What are the signs or symptoms? Symptoms may vary depending on the type of urinary incontinence you have. They include: A sudden urge to urinate, and passing urine involuntarily before you can get to a bathroom (urge incontinence). Suddenly passing urine when doing activities that force urine to pass, such as coughing, laughing, exercising, or sneezing (stress incontinence). Needing to urinate often but urinating only a small amount, or constantly dribbling urine (overflow incontinence). Urinating  because you cannot get to the bathroom in time due to a physical disability, such as arthritis or injury, or due to a communication or thinking problem, such as Alzheimer's disease (functional incontinence). How is this diagnosed? This condition may be diagnosed based on: Your medical history. A physical exam. Tests, such as: Urine tests. X-rays of your kidney and bladder. Ultrasound. CT scan. Cystoscopy. In this procedure, a health care provider inserts a tube with a light and camera (cystoscope) through the urethra and into the bladder to check for problems. Urodynamic testing. These tests assess how well the bladder, urethra, and sphincter can store and release urine. There are different types of urodynamic tests, and they vary depending on what the test is measuring. To help diagnose your condition, your health care provider may recommend that you keep a log of when you urinate and how much you urinate. How is this treated? Treatment for this condition depends on the type of incontinence that you have and its cause. Treatment may include: Lifestyle changes, such as: Quitting smoking. Maintaining a healthy weight. Staying active. Try to get 150 minutes of moderate-intensity exercise every week. Ask your health care provider which activities are safe for you. Eating a healthy diet. Avoid high-fat foods, like fried foods. Avoid refined carbohydrates like white bread and white rice. Limit how much alcohol and caffeine you drink. Increase your fiber intake. Healthy sources of fiber include beans, whole grains, and fresh fruits and vegetables. Behavioral changes, such as: Pelvic floor muscle exercises. Bladder training, such as lengthening the amount of time between bathroom breaks, or using the bathroom at regular intervals. Using techniques to suppress bladder urges. This can include distraction techniques or controlled breathing exercises. Medicines, such as: Medicines to relax the  bladder   muscles and prevent bladder spasms. Medicines to help slow or prevent the growth of a man's prostate. Botox injections. These can help relax the bladder muscles. Treatments, such as: Using pulses of electricity to help change bladder reflexes (electrical nerve stimulation). For women, using a medical device to prevent urine leaks. This is a small, tampon-like, disposable device that is inserted into the urethra. Injecting collagen or carbon beads (bulking agents) into the urinary sphincter. These can help thicken tissue and close the bladder opening. Surgery. Follow these instructions at home: Lifestyle Limit alcohol and caffeine. These can fill your bladder quickly and irritate it. Keep yourself clean to help prevent odors and skin damage. Ask your health care provider about special skin creams and cleansers that can protect the skin from urine. Consider wearing pads or adult diapers. Make sure to change them regularly, and always change them right after experiencing incontinence. General instructions Take over-the-counter and prescription medicines only as told by your health care provider. Use the bathroom about every 3-4 hours, even if you do not feel the need to urinate. Try to empty your bladder completely every time. After urinating, wait a minute. Then try to urinate again. Make sure you are in a relaxed position while urinating. If your incontinence is caused by nerve problems, keep a log of the medicines you take and the times you go to the bathroom. Keep all follow-up visits. This is important. Where to find more information National Institute of Diabetes and Digestive and Kidney Diseases: www.niddk.nih.gov American Urology Association: www.urologyhealth.org Contact a health care provider if: You have pain that gets worse. Your incontinence gets worse. Get help right away if: You have a fever or chills. You are unable to urinate. You have redness in your groin area or  down your legs. Summary Urinary incontinence refers to a condition in which a person is unable to control where and when to pass urine. This condition may be caused by medicines, infection, weak bladder muscles, weak pelvic floor muscles, enlargement of the prostate (in men), or surgery. Factors such as older age, obesity, pregnancy and childbirth, menopause, neurological diseases, and chronic coughing may increase your risk for developing this condition. Types of urinary incontinence include urge incontinence, stress incontinence, overflow incontinence, and functional incontinence. This condition is usually treated first with lifestyle and behavioral changes, such as quitting smoking, eating a healthier diet, and doing regular pelvic floor exercises. Other treatment options include medicines, bulking agents, medical devices, electrical nerve stimulation, or surgery. This information is not intended to replace advice given to you by your health care provider. Make sure you discuss any questions you have with your health care provider. Document Revised: 01/20/2020 Document Reviewed: 01/20/2020 Elsevier Patient Education  2023 [employer] Inc.  

## 2022-07-02 ENCOUNTER — Ambulatory Visit (INDEPENDENT_AMBULATORY_CARE_PROVIDER_SITE_OTHER): Payer: BC Managed Care – PPO | Admitting: Nurse Practitioner

## 2022-07-02 ENCOUNTER — Encounter: Payer: Self-pay | Admitting: Nurse Practitioner

## 2022-07-02 VITALS — BP 136/85 | HR 60 | Temp 97.8°F | Ht 66.5 in | Wt 184.5 lb

## 2022-07-02 DIAGNOSIS — N393 Stress incontinence (female) (male): Secondary | ICD-10-CM | POA: Diagnosis not present

## 2022-07-02 LAB — URINALYSIS, ROUTINE W REFLEX MICROSCOPIC
Bilirubin, UA: NEGATIVE
Glucose, UA: NEGATIVE
Ketones, UA: NEGATIVE
Leukocytes,UA: NEGATIVE
Nitrite, UA: NEGATIVE
Protein,UA: NEGATIVE
Specific Gravity, UA: 1.015 (ref 1.005–1.030)
Urobilinogen, Ur: 0.2 mg/dL (ref 0.2–1.0)
pH, UA: 7.5 (ref 5.0–7.5)

## 2022-07-02 LAB — MICROSCOPIC EXAMINATION
Bacteria, UA: NONE SEEN
WBC, UA: NONE SEEN /hpf (ref 0–5)

## 2022-07-02 NOTE — Progress Notes (Signed)
BP 136/85   Pulse 60   Temp 97.8 F (36.6 C) (Oral)   Ht 5' 6.5" (1.689 m)   Wt 184 lb 8 oz (83.7 kg)   SpO2 98%   BMI 29.34 kg/m    Subjective:    Patient ID: Jaclyn West, female    DOB: [birth year], 52 y.o.   MRN: [id number]  HPI: Jaclyn West is a 52 y.o. female  Chief Complaint  Patient presents with   Urinary Incontinence    About 3 weeks ago when she runs. Patient states that this week is a little better.    URINARY INCONTINENCE For years has worn a pad while running, as noted at times would dribble a little while running.  Then about 3 weeks ago when running noticed heavier incontinence while running, more urine even with urination prior to running.  This week has improved some, has cut back on caffeine over past week to one cup.  Still has had to go, has to stop and pee during runs though.  This morning ran 6 miles and did not have to stop.    Has 3 children -- all by c-section.  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  Dysuria: no Urinary frequency: no Urgency: occasional Small volume voids: no Symptom severity: no Urinary incontinence: yes Foul odor: no Hematuria: no Abdominal pain: no Back pain: no Suprapubic pain/pressure: no Flank pain: no Fever:  no Vomiting: no Status: stable Previous urinary tract infection: when younger had a lot Sexual activity: monogamous History of sexually transmitted disease: no Treatments attempted: cutting back on caffeine    Relevant past medical, surgical, family and social history reviewed and updated as indicated. Interim medical history since our last visit reviewed. Allergies and medications reviewed and updated.  Review of Systems  Constitutional:  Negative for activity change, appetite change, diaphoresis, fatigue and fever.  Respiratory:  Negative for cough, chest tightness and shortness of breath.   Cardiovascular:  Negative for chest pain, palpitations and leg swelling.  Gastrointestinal: Negative.   Neurological:   Negative for dizziness, syncope, weakness, light-headedness, numbness and headaches.  Psychiatric/Behavioral: Negative.     Per HPI unless specifically indicated above     Objective:    BP 136/85   Pulse 60   Temp 97.8 F (36.6 C) (Oral)   Ht 5' 6.5" (1.689 m)   Wt 184 lb 8 oz (83.7 kg)   SpO2 98%   BMI 29.34 kg/m   Wt Readings from Last 3 Encounters:  07/02/22 184 lb 8 oz (83.7 kg)  05/06/22 185 lb 1.6 oz (84 kg)  04/24/21 192 lb 3.2 oz (87.2 kg)    Physical Exam Vitals and nursing note reviewed.  Constitutional:      General: She is awake. She is not in acute distress.    Appearance: She is well-developed and well-groomed. She is not ill-appearing or toxic-appearing.  HENT:     Head: Normocephalic.     Right Ear: Hearing and external ear normal.     Left Ear: Hearing and external ear normal.  Eyes:     General: Lids are normal.        Right eye: No discharge.        Left eye: No discharge.     Conjunctiva/sclera: Conjunctivae normal.     Pupils: Pupils are equal, round, and reactive to light.  Neck:     Thyroid: No thyromegaly.     Vascular: No carotid bruit.  Cardiovascular:  Rate and Rhythm: Normal rate and regular rhythm.     Heart sounds: Normal heart sounds. No murmur heard.    No gallop.  Pulmonary:     Effort: Pulmonary effort is normal. No accessory muscle usage or respiratory distress.     Breath sounds: Normal breath sounds.  Abdominal:     General: Bowel sounds are normal. There is no distension.     Palpations: Abdomen is soft.     Tenderness: There is no abdominal tenderness.  Musculoskeletal:     Cervical back: Normal range of motion and neck supple.     Right lower leg: No edema.     Left lower leg: No edema.  Skin:    General: Skin is warm and dry.  Neurological:     Mental Status: She is alert and oriented to person, place, and time.  Psychiatric:        Attention and Perception: Attention normal.        Mood and Affect: Mood normal.         Speech: Speech normal.        Behavior: Behavior normal. Behavior is cooperative.        Thought Content: Thought content normal.    Results for orders placed or performed in visit on 07/02/22  Microscopic Examination   Urine  Result Value Ref Range   WBC, UA None seen 0 - 5 /hpf   RBC, Urine 0-2 0 - 2 /hpf   Epithelial Cells (non renal) 0-10 0 - 10 /hpf   Bacteria, UA None seen None seen/Few  Urinalysis, Routine w reflex microscopic  Result Value Ref Range   Specific Gravity, UA 1.015 1.005 - 1.030   pH, UA 7.5 5.0 - 7.5   Color, UA Yellow Yellow   Appearance Ur Clear Clear   Leukocytes,UA Negative Negative   Protein,UA Negative Negative/Trace   Glucose, UA Negative Negative   Ketones, UA Negative Negative   RBC, UA Trace (A) Negative   Bilirubin, UA Negative Negative   Urobilinogen, Ur 0.2 0.2 - 1.0 mg/dL   Nitrite, UA Negative Negative   Microscopic Examination See below:       Assessment & Plan:   Problem List Items Addressed This Visit       Other   Stress incontinence, female - Primary    Ongoing for years with some recent worsening, which has improved with decreasing caffeine.  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 At this time recommend continue to cut back on caffeine and alcohol use.  Start out with working on pelvic exercises -- provided her pamphlet today to work on at home and recommended some videos online.  If ongoing then consider referral to pelvic PT, may need to send to [facility] or [place] for this.  UA trace blood only today.  Continue to monitor and to return if worsening.      Relevant Orders   Urinalysis, Routine w reflex microscopic (Completed)     Follow up plan: Return if symptoms worsen or fail to improve.

## 2022-07-02 NOTE — Assessment & Plan Note (Signed)
Ongoing for years with some recent worsening, which has improved with decreasing caffeine.  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 At this time recommend continue to cut back on caffeine and alcohol use.  Start out with working on pelvic exercises -- provided her pamphlet today to work on at home and recommended some videos online.  If ongoing then consider referral to pelvic PT, may need to send to [facility] or [place] for this.  UA trace blood only today.  Continue to monitor and to return if worsening.

## 2022-07-09 ENCOUNTER — Encounter: Payer: Self-pay | Admitting: Gastroenterology

## 2022-07-17 ENCOUNTER — Encounter
Admission: RE | Disposition: A | Discharge status: Home / Self Care | Payer: Self-pay | Source: Home / Self Care | Attending: Gastroenterology

## 2022-07-17 ENCOUNTER — Ambulatory Visit: Payer: BC Managed Care – PPO | Admitting: Anesthesiology

## 2022-07-17 ENCOUNTER — Encounter: Payer: Self-pay | Admitting: Gastroenterology

## 2022-07-17 ENCOUNTER — Other Ambulatory Visit: Payer: Self-pay

## 2022-07-17 ENCOUNTER — Ambulatory Visit
Admission: RE | Admit: 2022-07-17 | Discharge: 2022-07-17 | Disposition: A | Discharge status: Home / Self Care | Payer: BC Managed Care – PPO | Attending: Gastroenterology | Admitting: Gastroenterology

## 2022-07-17 DIAGNOSIS — D124 Benign neoplasm of descending colon: Secondary | ICD-10-CM | POA: Insufficient documentation

## 2022-07-17 DIAGNOSIS — Z6829 Body mass index (BMI) 29.0-29.9, adult: Secondary | ICD-10-CM | POA: Insufficient documentation

## 2022-07-17 DIAGNOSIS — Z1211 Encounter for screening for malignant neoplasm of colon: Secondary | ICD-10-CM | POA: Diagnosis not present

## 2022-07-17 DIAGNOSIS — D123 Benign neoplasm of transverse colon: Secondary | ICD-10-CM | POA: Diagnosis not present

## 2022-07-17 DIAGNOSIS — E669 Obesity, unspecified: Secondary | ICD-10-CM | POA: Insufficient documentation

## 2022-07-17 DIAGNOSIS — F32A Depression, unspecified: Secondary | ICD-10-CM | POA: Insufficient documentation

## 2022-07-17 DIAGNOSIS — K635 Polyp of colon: Secondary | ICD-10-CM

## 2022-07-17 DIAGNOSIS — D125 Benign neoplasm of sigmoid colon: Secondary | ICD-10-CM | POA: Diagnosis not present

## 2022-07-17 HISTORY — DX: Motion sickness, initial encounter: T75.3XXA

## 2022-07-17 HISTORY — DX: Presence of spectacles and contact lenses: Z97.3

## 2022-07-17 HISTORY — PX: POLYPECTOMY: SHX5525

## 2022-07-17 HISTORY — PX: COLONOSCOPY WITH PROPOFOL: SHX5780

## 2022-07-17 LAB — POCT PREGNANCY, URINE: Preg Test, Ur: NEGATIVE

## 2022-07-17 SURGERY — COLONOSCOPY WITH PROPOFOL
Anesthesia: General | Site: Rectum

## 2022-07-17 MED ORDER — PROPOFOL 10 MG/ML IV BOLUS
INTRAVENOUS | Status: DC | PRN
  Administered 2022-07-17: 80 mg via INTRAVENOUS
  Administered 2022-07-17: 50 mg via INTRAVENOUS
  Administered 2022-07-17: 40 mg via INTRAVENOUS
  Administered 2022-07-17: 30 mg via INTRAVENOUS
  Administered 2022-07-17: 20 mg via INTRAVENOUS
  Administered 2022-07-17: 50 mg via INTRAVENOUS

## 2022-07-17 MED ORDER — SODIUM CHLORIDE 0.9 % IV SOLN: INTRAVENOUS | Status: DC

## 2022-07-17 MED ORDER — STERILE WATER FOR IRRIGATION IR SOLN
Status: DC | PRN
  Administered 2022-07-17: 150 mL

## 2022-07-17 MED ORDER — LIDOCAINE HCL (CARDIAC) PF 100 MG/5ML IV SOSY
PREFILLED_SYRINGE | INTRAVENOUS | Status: DC | PRN
  Administered 2022-07-17: 40 mg via INTRAVENOUS

## 2022-07-17 MED ORDER — LACTATED RINGERS IV SOLN: INTRAVENOUS | Status: DC

## 2022-07-17 SURGICAL SUPPLY — 25 items
CLIP HMST 235XBRD CATH ROT (MISCELLANEOUS) IMPLANT
CLIP RESOLUTION 360 11X235 (MISCELLANEOUS)
ELECT REM PT RETURN 9FT ADLT (ELECTROSURGICAL)
ELECTRODE REM PT RTRN 9FT ADLT (ELECTROSURGICAL) IMPLANT
FCP ESCP3.2XJMB 240X2.8X (MISCELLANEOUS)
FORCEPS BIOP RAD 4 LRG CAP 4 (CUTTING FORCEPS) IMPLANT
FORCEPS BIOP RJ4 240 W/NDL (MISCELLANEOUS)
FORCEPS ESCP3.2XJMB 240X2.8X (MISCELLANEOUS) IMPLANT
GOWN CVR UNV OPN BCK APRN NK (MISCELLANEOUS) ×4 IMPLANT
GOWN ISOL THUMB LOOP REG UNIV (MISCELLANEOUS) ×4
INJECTOR VARIJECT VIN23 (MISCELLANEOUS) IMPLANT
KIT DEFENDO VALVE AND CONN (KITS) IMPLANT
KIT PRC NS LF DISP ENDO (KITS) ×2 IMPLANT
KIT PROCEDURE OLYMPUS (KITS) ×2
MANIFOLD NEPTUNE II (INSTRUMENTS) ×2 IMPLANT
MARKER SPOT ENDO TATTOO 5ML (MISCELLANEOUS) IMPLANT
PROBE APC STR FIRE (PROBE) IMPLANT
RETRIEVER NET ROTH 2.5X230 LF (MISCELLANEOUS) IMPLANT
SNARE COLD EXACTO (MISCELLANEOUS) IMPLANT
SNARE SHORT THROW 13M SML OVAL (MISCELLANEOUS) IMPLANT
SNARE SHORT THROW 30M LRG OVAL (MISCELLANEOUS) IMPLANT
SNARE SNG USE RND 15MM (INSTRUMENTS) IMPLANT
TRAP ETRAP POLY (MISCELLANEOUS) IMPLANT
VARIJECT INJECTOR VIN23 (MISCELLANEOUS)
WATER STERILE IRR 250ML POUR (IV SOLUTION) ×2 IMPLANT

## 2022-07-17 NOTE — Anesthesia Postprocedure Evaluation (Signed)
Anesthesia Post Note  Patient: Jaclyn West  Procedure(s) Performed: COLONOSCOPY WITH PROPOFOL (Rectum) POLYPECTOMY (Rectum)  Patient location during evaluation: PACU Anesthesia Type: General Level of consciousness: awake and alert Pain management: pain level controlled Vital Signs Assessment: post-procedure vital signs reviewed and stable Respiratory status: spontaneous breathing, nonlabored ventilation, respiratory function stable and patient connected to nasal cannula oxygen Cardiovascular status: blood pressure returned to baseline and stable Postop Assessment: no apparent nausea or vomiting Anesthetic complications: no   No notable events documented.   Last Vitals:  Vitals:   07/17/22 0904 07/17/22 0914  BP: 117/67   Pulse: 69   Resp: 19   Temp: 36.4 C (!) 36.4 C  SpO2: 100%     Last Pain:  Vitals:   07/17/22 0914  TempSrc:   PainSc: 0-No pain                 Arita Miss

## 2022-07-17 NOTE — H&P (Signed)
Cephas Darby, MD [address]  [place]  [place], [place] [address]  Main: [phone]  Fax: [phone] Pager: [phone]  Primary Care Physician:  Venita Lick, NP Primary Gastroenterologist:  Dr. Cephas Darby  Pre-Procedure History & Physical: HPI:  Jaclyn West is a 52 y.o. female is here for an colonoscopy.   Past Medical History:  Diagnosis Date   Acne    Depression    Double cervix    Motion sickness    car - back seat   Obesity    Premenopause menorrhagia    Uterus didelphys    Wears contact lenses     Past Surgical History:  Procedure Laterality Date   CESAREAN SECTION     x 2   SKIN LESION EXCISION      Prior to Admission medications   Medication Sig Start Date End Date Taking? Authorizing Provider  Coenzyme Q10 200 MG capsule Take 200 mg by mouth daily.   Yes [provider]  Melatonin 10 MG TABS Take by mouth.   Yes [provider]  Omega-3 Fatty Acids (OMEGA-3 PO) Take by mouth.   Yes [provider]  TART CHERRY PO Take by mouth.   Yes [provider]  Turmeric (QC TUMERIC COMPLEX PO) Take by mouth.   Yes [provider]  VITAMIN D PO Take by mouth.   Yes [provider]    Allergies as of 05/07/2022 - Review Complete 05/06/2022  Allergen Reaction Noted   Iodinated contrast media Itching 12/26/2015   Red dye Itching 04/14/2016    Family History  Problem Relation Age of Onset   Hypothyroidism Mother    Hypertension Mother    Arthritis Mother    Kidney cancer Mother    Heart disease Father        MI   Heart disease Brother    Brain cancer Brother    Heart disease Brother    Anemia Daughter    Asthma Son    Heart disease Maternal Grandmother        CHF   Stroke Maternal Grandfather    Heart disease Paternal Grandfather     Social History   Socioeconomic History   Marital status: Married    Spouse name: Not on file   Number of children: Not on file   Years  of education: Not on file   Highest education level: Not on file  Occupational History   Not on file  Tobacco Use   Smoking status: Never   Smokeless tobacco: Never  Vaping Use   Vaping Use: Never used  Substance and Sexual Activity   Alcohol use: No    Alcohol/week: 0.0 standard drinks of alcohol   Drug use: No   Sexual activity: Yes  Other Topics Concern   Not on file  Social History Narrative   Not on file   Social Determinants of Health   Financial Resource Strain: Low Risk  (04/24/2021)   Overall Financial Resource Strain (CARDIA)    Difficulty of Paying Living Expenses: Not hard at all  Food Insecurity: No Food Insecurity (04/24/2021)   Hunger Vital Sign    Worried About Running Out of Food in the Last Year: Never true    [place] in the Last Year: Never true  Transportation Needs: No Transportation Needs (04/24/2021)   PRAPARE - Hydrologist (Medical): No    Lack of Transportation (Non-Medical): No  Physical  Activity: Sufficiently Active (04/24/2021)   Exercise Vital Sign    Days of Exercise per Week: 6 days    Minutes of Exercise per Session: 30 min  Stress: No Stress Concern Present (04/24/2021)   [place]    Feeling of Stress : Only a little  Social Connections: Moderately Integrated (04/24/2021)   Social Connection and Isolation Panel [NHANES]    Frequency of Communication with Friends and Family: More than three times a week    Frequency of Social Gatherings with Friends and Family: More than three times a week    Attends Religious Services: More than 4 times per year    Active Member of [employer] or Organizations: No    Attends Archivist Meetings: Never    Marital Status: Married  Human resources officer Violence: Not At Risk (04/24/2021)   Humiliation, Afraid, Rape, and Kick questionnaire    Fear of Current or Ex-Partner: No    Emotionally Abused: No     Physically Abused: No    Sexually Abused: No    Review of Systems: See HPI, otherwise negative ROS  Physical Exam: BP 123/68   Temp 97.9 F (36.6 C) (Tympanic)   Ht 5' 6.5" (1.689 m)   Wt 82.8 kg   LMP 06/15/2022 (Approximate) Comment: UPreg Neg  SpO2 100%   BMI 29.02 kg/m  General:   Alert,  pleasant and cooperative in NAD Head:  Normocephalic and atraumatic. Neck:  Supple; no masses or thyromegaly. Lungs:  Clear throughout to auscultation.    Heart:  Regular rate and rhythm. Abdomen:  Soft, nontender and nondistended. Normal bowel sounds, without guarding, and without rebound.   Neurologic:  Alert and  oriented x4;  grossly normal neurologically.  Impression/Plan: Jaclyn West is here for an colonoscopy to be performed for colon cancer screening  Risks, benefits, limitations, and alternatives regarding  colonoscopy have been reviewed with the patient.  Questions have been answered.  All parties agreeable.   Sherri Sear, MD  07/17/2022, 7:48 AM

## 2022-07-17 NOTE — Anesthesia Preprocedure Evaluation (Signed)
Anesthesia Evaluation  Patient identified by MRN, date of birth, ID band Patient awake    Reviewed: Allergy & Precautions, NPO status , Patient's Chart, lab work & pertinent test results  History of Anesthesia Complications Negative for: history of anesthetic complications  Airway Mallampati: II  TM Distance: >3 FB Neck ROM: Full    Dental no notable dental hx. (+) Teeth Intact   Pulmonary neg pulmonary ROS, neg sleep apnea, neg COPD, Patient abstained from smoking.Not current smoker   Pulmonary exam normal breath sounds clear to auscultation       Cardiovascular Exercise Tolerance: Good METS(-) hypertension(-) CAD and (-) Past MI negative cardio ROS (-) dysrhythmias  Rhythm:Regular Rate:Normal - Systolic murmurs    Neuro/Psych  PSYCHIATRIC DISORDERS  Depression    negative neurological ROS     GI/Hepatic ,neg GERD  ,,(+)     (-) substance abuse    Endo/Other  neg diabetes    Renal/GU negative Renal ROS     Musculoskeletal   Abdominal   Peds  Hematology   Anesthesia Other Findings Past Medical History: No date: Acne No date: Depression No date: Double cervix No date: Motion sickness     Comment:  car - back seat No date: Obesity No date: Premenopause menorrhagia No date: Uterus didelphys No date: Wears contact lenses  Reproductive/Obstetrics                             Anesthesia Physical Anesthesia Plan  ASA: 1  Anesthesia Plan: General   Post-op Pain Management: Minimal or no pain anticipated   Induction: Intravenous  PONV Risk Score and Plan: 3 and Propofol infusion, TIVA and Ondansetron  Airway Management Planned: Nasal Cannula  Additional Equipment: None  Intra-op Plan:   Post-operative Plan:   Informed Consent: I have reviewed the patients History and Physical, chart, labs and discussed the procedure including the risks, benefits and alternatives for the  proposed anesthesia with the patient or authorized representative who has indicated his/her understanding and acceptance.     Dental advisory given  Plan Discussed with: CRNA and Surgeon  Anesthesia Plan Comments: (Discussed risks of anesthesia with patient, including possibility of difficulty with spontaneous ventilation under anesthesia necessitating airway intervention, PONV, and rare risks such as cardiac or respiratory or neurological events, and allergic reactions. Discussed the role of CRNA in patient's perioperative care. Patient understands.)       Anesthesia Quick Evaluation

## 2022-07-17 NOTE — Op Note (Signed)
[facility] Gastroenterology Patient Name: Jaclyn West Procedure Date: 07/17/2022 8:20 AM MRN: [id number] Account #: [id number] Date of Birth: 1971/04/05 Admit Type: Outpatient Age: 52 Room: [facility] OR ROOM 01 Gender: Female Note Status: Finalized Instrument Name: [id number] Procedure:             Colonoscopy Indications:           Screening for colorectal malignant neoplasm, This is                         the patient's first colonoscopy Providers:             Lin Landsman MD, MD Referring MD:          Barbaraann Faster. Ned Card (Referring MD) Medicines:             General Anesthesia Complications:         No immediate complications. Estimated blood loss: None. Procedure:             Pre-Anesthesia Assessment:                        - Prior to the procedure, a History and Physical was                         performed, and patient medications and allergies were                         reviewed. The patient is competent. The risks and                         benefits of the procedure and the sedation options and                         risks were discussed with the patient. All questions                         were answered and informed consent was obtained.                         Patient identification and proposed procedure were                         verified by the physician, the nurse, the                         anesthesiologist, the anesthetist and the technician                         in the pre-procedure area in the procedure room in the                         endoscopy suite. Mental Status Examination: alert and                         oriented. Airway Examination: normal oropharyngeal                         airway and neck mobility. Respiratory Examination:  clear to auscultation. CV Examination: normal.                         Prophylactic Antibiotics: The patient does not require                         prophylactic antibiotics.  Prior Anticoagulants: The                         patient has taken no anticoagulant or antiplatelet                         agents. ASA Grade Assessment: II - A patient with mild                         systemic disease. After reviewing the risks and                         benefits, the patient was deemed in satisfactory                         condition to undergo the procedure. The anesthesia                         plan was to use general anesthesia. Immediately prior                         to administration of medications, the patient was                         re-assessed for adequacy to receive sedatives. The                         heart rate, respiratory rate, oxygen saturations,                         blood pressure, adequacy of pulmonary ventilation, and                         response to care were monitored throughout the                         procedure. The physical status of the patient was                         re-assessed after the procedure.                        After obtaining informed consent, the colonoscope was                         passed under direct vision. Throughout the procedure,                         the patient's blood pressure, pulse, and oxygen                         saturations were monitored continuously. The  Colonoscope was introduced through the anus and                         advanced to the the cecum, identified by appendiceal                         orifice and ileocecal valve. The colonoscopy was                         performed with moderate difficulty due to significant                         looping. Successful completion of the procedure was                         aided by applying abdominal pressure. The patient                         tolerated the procedure well. The quality of the bowel                         preparation was good. The ileocecal valve, appendiceal                         orifice, and  rectum were photographed. Findings:      The perianal and digital rectal examinations were normal. Pertinent       negatives include normal sphincter tone and no palpable rectal lesions.      Three sessile polyps were found in the sigmoid colon, descending colon       and transverse colon. The polyps were 4 to 5 mm in size. These polyps       were removed with a cold snare. Resection and retrieval were complete.       Estimated blood loss: none.      The retroflexed view of the distal rectum and anal verge was normal and       showed no anal or rectal abnormalities. Impression:            - Three 4 to 5 mm polyps in the sigmoid colon, in the                         descending colon and in the transverse colon, removed                         with a cold snare. Resected and retrieved.                        - The distal rectum and anal verge are normal on                         retroflexion view. Recommendation:        - Discharge patient to home (with escort).                        - Resume previous diet today.                        - Continue present medications.                        -  Await pathology results.                        - Repeat colonoscopy in 5-10 years for surveillance                         based on pathology results. Procedure Code(s):     --- Professional ---                        [phone], Colonoscopy, flexible; with removal of                         tumor(s), polyp(s), or other lesion(s) by snare                         technique Diagnosis Code(s):     --- Professional ---                        Z12.11, Encounter for screening for malignant neoplasm                         of colon                        D12.5, Benign neoplasm of sigmoid colon                        D12.4, Benign neoplasm of descending colon                        D12.3, Benign neoplasm of transverse colon (hepatic                         flexure or splenic flexure) CPT copyright 2022  American Medical Association. All rights reserved. The codes documented in this report are preliminary and upon coder review may  be revised to meet current compliance requirements. Dr. Ulyess Mort Lin Landsman MD, MD 07/17/2022 9:03:18 AM This report has been signed electronically. Number of Addenda: 0 Note Initiated On: 07/17/2022 8:20 AM Scope Withdrawal Time: 0 hours 13 minutes 16 seconds  Total Procedure Duration: 0 hours 24 minutes 55 seconds  Estimated Blood Loss:  Estimated blood loss: none.      [facility]

## 2022-07-17 NOTE — Transfer of Care (Signed)
Immediate Anesthesia Transfer of Care Note  Patient: Jaclyn West  Procedure(s) Performed: COLONOSCOPY WITH PROPOFOL (Rectum) POLYPECTOMY (Rectum)  Patient Location: PACU  Anesthesia Type: General  Level of Consciousness: awake, alert  and patient cooperative  Airway and Oxygen Therapy: Patient Spontanous Breathing and Patient connected to supplemental oxygen  Post-op Assessment: Post-op Vital signs reviewed, Patient's Cardiovascular Status Stable, Respiratory Function Stable, Patent Airway and No signs of Nausea or vomiting  Post-op Vital Signs: Reviewed and stable  Complications: No notable events documented.

## 2022-07-18 ENCOUNTER — Encounter: Payer: Self-pay | Admitting: Gastroenterology

## 2022-07-21 LAB — SURGICAL PATHOLOGY

## 2022-07-25 ENCOUNTER — Encounter: Payer: Self-pay | Admitting: Gastroenterology

## 2022-08-13 ENCOUNTER — Encounter: Payer: Self-pay | Admitting: Nurse Practitioner

## 2022-12-01 ENCOUNTER — Encounter: Payer: Self-pay | Admitting: Nurse Practitioner

## 2022-12-01 ENCOUNTER — Ambulatory Visit: Payer: BC Managed Care – PPO | Admitting: Nurse Practitioner

## 2022-12-01 ENCOUNTER — Ambulatory Visit: Payer: Self-pay | Admitting: *Deleted

## 2022-12-01 VITALS — BP 124/83 | HR 58 | Temp 98.0°F | Ht 66.5 in | Wt 196.6 lb

## 2022-12-01 DIAGNOSIS — R42 Dizziness and giddiness: Secondary | ICD-10-CM

## 2022-12-01 LAB — URINALYSIS, ROUTINE W REFLEX MICROSCOPIC
Bilirubin, UA: NEGATIVE
Glucose, UA: NEGATIVE
Ketones, UA: NEGATIVE
Leukocytes,UA: NEGATIVE
Nitrite, UA: NEGATIVE
Protein,UA: NEGATIVE
Specific Gravity, UA: 1.02 (ref 1.005–1.030)
Urobilinogen, Ur: 0.2 mg/dL (ref 0.2–1.0)
pH, UA: 6 (ref 5.0–7.5)

## 2022-12-01 LAB — MICROSCOPIC EXAMINATION

## 2022-12-01 MED ORDER — METHYLPREDNISOLONE 4 MG PO TBPK: ORAL_TABLET | ORAL | 0 refills | Status: DC

## 2022-12-01 NOTE — Telephone Encounter (Signed)
  Chief Complaint: new onset dizziness Symptoms: dizziness with standing, some fatigue , patient states she did have headache once at start and she did have heavy menses- due to skipping a couple months- but nothing too bad. Frequency: 1 week Pertinent Negatives: Patient denies fever, chest pain, vomiting, diarrhea, current bleeding Disposition: [] ED /[] Urgent Care (no appt availability in office) / [x] Appointment(In office/virtual)/ []  [place] [facility]/ [] Home Care/ [] Refused Recommended Disposition /[] [place] [facility]/ []  Follow-up with PCP Additional Notes: Appointment scheduled for evaluation

## 2022-12-01 NOTE — Progress Notes (Signed)
BP 124/83   Pulse (!) 58   Temp 98 F (36.7 C) (Oral)   Ht 5' 6.5" (1.689 m)   Wt 196 lb 9.6 oz (89.2 kg)   SpO2 98%   BMI 31.26 kg/m    Subjective:    Patient ID: Jaclyn West, female    DOB: 01-03-1971, 52 y.o.   MRN: [id number]  HPI: Jaclyn West is a 52 y.o. female  Chief Complaint  Patient presents with   Dizzziness    New onset, started last Tuesday and ended on the weekend and came back today. Only happens when she stands in one place too long, does not happen when she is standing and moving around   DIZZINESS Patient states the symptoms started on Tuesday then resolved on the weekend.  Then it came back today.  On Tuesday she felt like she was going to pass out.  Feels weak at times.  Primarily happens when she is standing still.   Duration: days Description of symptoms: lightheaded and off kilter Duration of episode: minutes Dizziness frequency: no history of the same Provoking factors:  standing still Aggravating factors:   standing still Triggered by rolling over in bed: no Triggered by bending over: no Aggravated by head movement: no Aggravated by exertion, coughing, loud noises: no Recent head injury: no Recent or current viral symptoms: yes- headache on the first daya History of vasovagal episodes: no Nausea: no Vomiting: no Tinnitus: no Hearing loss: no Aural fullness: no Headache: yes- resolved Photophobia/phonophobia: no Unsteady gait: yes Postural instability: yes Diplopia, dysarthria, dysphagia or weakness: yes- weakness Related to exertion: no Pallor: no Diaphoresis: no Dyspnea: no Chest pain: no Drinking about 60-80 ounces daily.  She started drinking some electrolytes.      Relevant past medical, surgical, family and social history reviewed and updated as indicated. Interim medical history since our last visit reviewed. Allergies and medications reviewed and updated.  Review of Systems  Constitutional:  Negative for fever.   HENT:  Negative for congestion.   Eyes:  Negative for photophobia and visual disturbance.  Neurological:  Positive for dizziness and headaches.    Per HPI unless specifically indicated above     Objective:    BP 124/83   Pulse (!) 58   Temp 98 F (36.7 C) (Oral)   Ht 5' 6.5" (1.689 m)   Wt 196 lb 9.6 oz (89.2 kg)   SpO2 98%   BMI 31.26 kg/m   Wt Readings from Last 3 Encounters:  12/01/22 196 lb 9.6 oz (89.2 kg)  07/17/22 182 lb 8 oz (82.8 kg)  07/02/22 184 lb 8 oz (83.7 kg)    Physical Exam Vitals and nursing note reviewed.  Constitutional:      General: She is not in acute distress.    Appearance: Normal appearance. She is normal weight. She is not ill-appearing, toxic-appearing or diaphoretic.  HENT:     Head: Normocephalic.     Right Ear: External ear normal.     Left Ear: External ear normal.     Nose: Nose normal.     Mouth/Throat:     Mouth: Mucous membranes are moist.     Pharynx: Oropharynx is clear.  Eyes:     General:        Right eye: No discharge.        Left eye: No discharge.     Extraocular Movements: Extraocular movements intact.     Conjunctiva/sclera: Conjunctivae normal.  Pupils: Pupils are equal, round, and reactive to light.  Cardiovascular:     Rate and Rhythm: Normal rate and regular rhythm.     Heart sounds: No murmur heard. Pulmonary:     Effort: Pulmonary effort is normal. No respiratory distress.     Breath sounds: Normal breath sounds. No wheezing or rales.  Musculoskeletal:     Cervical back: Normal range of motion and neck supple.  Skin:    General: Skin is warm and dry.     Capillary Refill: Capillary refill takes less than 2 seconds.  Neurological:     General: No focal deficit present.     Mental Status: She is alert and oriented to person, place, and time. Mental status is at baseline.  Psychiatric:        Mood and Affect: Mood normal.        Behavior: Behavior normal.        Thought Content: Thought content normal.         Judgment: Judgment normal.     Results for orders placed or performed during the hospital encounter of 07/17/22  Pregnancy, urine POC  Result Value Ref Range   Preg Test, Ur NEGATIVE NEGATIVE  Surgical pathology  Result Value Ref Range   SURGICAL PATHOLOGY      SURGICAL PATHOLOGY CASE: [id number] PATIENT: Rollene Rotunda Surgical Pathology Report     Specimen Submitted: A. Colon polyp, transverse; cold snare B. Colon polyp, des; cold snare C. Colon polyp, sigmoid; cold snare  Clinical History: Screening for colon cancer.      DIAGNOSIS: A.  COLON, TRANSVERSE, POLYP; BIOPSY: - TUBULAR ADENOMA. - NO EVIDENCE OF HIGH-GRADE DYSPLASIA OR MALIGNANCY.  B.  COLON, DESCENDING, POLYP; COLD SNARE BIOPSIES: - BENIGN HYPERPLASTIC POLYP.  C.  COLON, SIGMOID, POLYP; COLD SNARE BIOPSY: - SESSILE SERRATED POLYP. - NO EVIDENCE OF HIGH-GRADE DYSPLASIA OR MALIGNANCY.  GROSS DESCRIPTION: A. Labeled: Transverse colon polyp-cold snare Received: Formalin Collection time: 8:21 AM on 07/17/2022 Placed into formalin time: 8:21 AM on 07/17/2022 Tissue fragment(s): 1 Size: 0.9 x 0.4 x 0.1 cm Description: Tan soft tissue fragment Entirely submitted in 1 cassette.  B. Labeled: Descending colon polyp-cold snare Received: Formalin Col lection time: 8:56 AM on 07/17/2022 Placed into formalin time: 8:56 AM on 07/17/2022 Tissue fragment(s): Multiple Size: Aggregate, 1.2 x 0.6 x 0.1 cm Description: Received is at least 1 tan soft tissue fragment, admixed with intestinal debris.  The ratio of soft tissue to intestinal debris is 90: 10. Entirely submitted in 1 cassette.  C. Labeled: Sigmoid colon polyp-cold snare Received: Formalin Collection time: 9:00 AM on 07/17/2022 Placed into formalin time: 9:00 AM on 07/17/2022 Tissue fragment(s): Multiple Size: Aggregate, 1.2 x 0.4 x 0.1 cm Description: Received is at least 1 tan soft tissue fragment, admixed with intestinal debris.  The ratio  of soft tissue to intestinal debris is 90: 10. Entirely submitted in 1 cassette  CM 07/18/2022.  Final Diagnosis performed by Alcario Drought, MD.   Electronically signed 07/21/2022 11:26:08AM The electronic signature indicates that the named Attending Pathologist has evaluated the specimen Technical component performed at [facility], [address] [place], [place] [address] Lab: [phone] Dir: Jolene Schimke, MD, MMM  Professional component performed at [facility], [facility], [address] [place], [place], [place] [address] Lab: [phone] Dir: Beryle Quant, MD       Assessment & Plan:   Problem List Items Addressed This Visit   None Visit Diagnoses     Dizziness    -  Primary   Suspect it is related to middle ear effusion. Will check labs at visit today. Will treat with prednisone. Also recommend antihistamine such as zyrtec or allegra   Relevant Orders   Comp Met (CMET)   Urinalysis, Routine w reflex microscopic   CBC w/Diff        Follow up plan: Return if symptoms worsen or fail to improve.

## 2022-12-01 NOTE — Telephone Encounter (Signed)
Summary: Dizzy   Dizziness for over a week, primarily when she stands still Best contact: [phone]     Reason for Disposition  [1] MILD dizziness (e.g., walking normally) AND [2] has NOT been evaluated by doctor (or NP/PA) for this  (Exception: Dizziness caused by heat exposure, sudden standing, or poor fluid intake.)  Answer Assessment - Initial Assessment Questions 1. DESCRIPTION: "Describe your dizziness."     lightheaded 2. LIGHTHEADED: "Do you feel lightheaded?" (e.g., somewhat faint, woozy, weak upon standing)     Standing is worse, somewhat faint 3. VERTIGO: "Do you feel like either you or the room is spinning or tilting?" (i.e. vertigo)     no 4. SEVERITY: "How bad is it?"  "Do you feel like you are going to faint?" "Can you stand and walk?"   - MILD: Feels slightly dizzy, but walking normally.   - MODERATE: Feels unsteady when walking, but not falling; interferes with normal activities (e.g., school, work).   - SEVERE: Unable to walk without falling, or requires assistance to walk without falling; feels like passing out now.      Occurring all week- weak, swimmy, dizzy headed  5. ONSET:  "When did the dizziness begin?"     1 week 6. AGGRAVATING FACTORS: "Does anything make it worse?" (e.g., standing, change in head position)     standing 7. HEART RATE: "Can you tell me your heart rate?" "How many beats in 15 seconds?"  (Note: not all patients can do this)       Normal-60's 8. CAUSE: "What do you think is causing the dizziness?"     Unsure- thought viral 9. RECURRENT SYMPTOM: "Have you had dizziness before?" If Yes, ask: "When was the last time?" "What happened that time?"     One time in nursing school 10. OTHER SYMPTOMS: "Do you have any other symptoms?" (e.g., fever, chest pain, vomiting, diarrhea, bleeding)       Chills/cold at times  Protocols used: Dizziness - Lightheadedness-A-AH

## 2022-12-02 LAB — COMPREHENSIVE METABOLIC PANEL
ALT: 26 IU/L (ref 0–32)
AST: 17 IU/L (ref 0–40)
Albumin/Globulin Ratio: 2.1 (ref 1.2–2.2)
Albumin: 4.4 g/dL (ref 3.8–4.9)
Alkaline Phosphatase: 50 IU/L (ref 44–121)
BUN/Creatinine Ratio: 30 — ABNORMAL HIGH (ref 9–23)
BUN: 27 mg/dL — ABNORMAL HIGH (ref 6–24)
Bilirubin Total: 0.3 mg/dL (ref 0.0–1.2)
CO2: 23 mmol/L (ref 20–29)
Calcium: 9.5 mg/dL (ref 8.7–10.2)
Chloride: 103 mmol/L (ref 96–106)
Creatinine, Ser: 0.9 mg/dL (ref 0.57–1.00)
Globulin, Total: 2.1 g/dL (ref 1.5–4.5)
Glucose: 80 mg/dL (ref 70–99)
Potassium: 4.4 mmol/L (ref 3.5–5.2)
Sodium: 139 mmol/L (ref 134–144)
Total Protein: 6.5 g/dL (ref 6.0–8.5)
eGFR: 77 mL/min/{1.73_m2} (ref >59)

## 2022-12-02 LAB — CBC WITH DIFFERENTIAL/PLATELET
Basophils Absolute: 0.1 10*3/uL (ref 0.0–0.2)
Basos: 1 %
EOS (ABSOLUTE): 0.1 10*3/uL (ref 0.0–0.4)
Eos: 1 %
Hematocrit: 40 % (ref 34.0–46.6)
Hemoglobin: 13.4 g/dL (ref 11.1–15.9)
Immature Grans (Abs): 0 10*3/uL (ref 0.0–0.1)
Immature Granulocytes: 0 %
Lymphocytes Absolute: 3.4 10*3/uL — ABNORMAL HIGH (ref 0.7–3.1)
Lymphs: 40 %
MCH: 31.9 pg (ref 26.6–33.0)
MCHC: 33.5 g/dL (ref 31.5–35.7)
MCV: 95 fL (ref 79–97)
Monocytes Absolute: 0.7 10*3/uL (ref 0.1–0.9)
Monocytes: 8 %
Neutrophils Absolute: 4.4 10*3/uL (ref 1.4–7.0)
Neutrophils: 50 %
Platelets: 275 10*3/uL (ref 150–450)
RBC: 4.2 x10E6/uL (ref 3.77–5.28)
RDW: 11.7 % (ref 11.7–15.4)
WBC: 8.6 10*3/uL (ref 3.4–10.8)

## 2022-12-02 NOTE — Progress Notes (Signed)
Hi Shilee.  Your lab work was unremarkable.  No explanation for your dizziness.  Continue with the plan as discussed during the visit.

## 2023-05-10 NOTE — Patient Instructions (Signed)
Be Involved in Caring For Your Health:  Taking Medications When medications are taken as directed, they can greatly improve your health. But if they are not taken as prescribed, they may not work. In some cases, not taking them correctly can be harmful. To help ensure your treatment remains effective and safe, understand your medications and how to take them. Bring your medications to each visit for review by your provider.  Your lab results, notes, and after visit summary will be available on My Chart. We strongly encourage you to use this feature. If lab results are abnormal the clinic will contact you with the appropriate steps. If the clinic does not contact you assume the results are satisfactory. You can always view your results on My Chart. If you have questions regarding your health or results, please contact the clinic during office hours. You can also ask questions on My Chart.  We at [facility] are grateful that you chose [place] to provide your care. We strive to provide evidence-based and compassionate care and are always looking for feedback. If you get a survey from the clinic please complete this so we can hear your opinions.  Healthy Eating, Adult Healthy eating may help you get and keep a healthy body weight, reduce the risk of chronic disease, and live a long and productive life. It is important to follow a healthy eating pattern. Your nutritional and calorie needs should be met mainly by different nutrient-rich foods. What are tips for following this plan? Reading food labels Read labels and choose the following: Reduced or low sodium products. Juices with 100% fruit juice. Foods with low saturated fats (<3 g per serving) and high polyunsaturated and monounsaturated fats. Foods with whole grains, such as whole wheat, cracked wheat, brown rice, and wild rice. Whole grains that are fortified with folic acid. This is recommended for females who are pregnant or who want to  become pregnant. Read labels and do not eat or drink the following: Foods or drinks with added sugars. These include foods that contain brown sugar, corn sweetener, corn syrup, dextrose, fructose, glucose, high-fructose corn syrup, honey, invert sugar, lactose, malt syrup, maltose, molasses, raw sugar, sucrose, trehalose, or turbinado sugar. Limit your intake of added sugars to less than 10% of your total daily calories. Do not eat more than the following amounts of added sugar per day: 6 teaspoons (25 g) for females. 9 teaspoons (38 g) for males. Foods that contain processed or refined starches and grains. Refined grain products, such as white flour, degermed cornmeal, white bread, and white rice. Shopping Choose nutrient-rich snacks, such as vegetables, whole fruits, and nuts. Avoid high-calorie and high-sugar snacks, such as potato chips, fruit snacks, and candy. Use oil-based dressings and spreads on foods instead of solid fats such as butter, margarine, sour cream, or cream cheese. Limit pre-made sauces, mixes, and "instant" products such as flavored rice, instant noodles, and ready-made pasta. Try more plant-protein sources, such as tofu, tempeh, black beans, edamame, lentils, nuts, and seeds. Explore eating plans such as the Mediterranean diet or vegetarian diet. Try heart-healthy dips made with beans and healthy fats like hummus and guacamole. Vegetables go great with these. Cooking Use oil to saut or stir-fry foods instead of solid fats such as butter, margarine, or lard. Try baking, boiling, grilling, or broiling instead of frying. Remove the fatty part of meats before cooking. Steam vegetables in water or broth. Meal planning  At meals, imagine dividing your plate into fourths: One-half of  your plate is fruits and vegetables. One-fourth of your plate is whole grains. One-fourth of your plate is protein, especially lean meats, poultry, eggs, tofu, beans, or nuts. Include low-fat  dairy as part of your daily diet. Lifestyle Choose healthy options in all settings, including home, work, school, restaurants, or stores. Prepare your food safely: Wash your hands after handling raw meats. Where you prepare food, keep surfaces clean by regularly washing with hot, soapy water. Keep raw meats separate from ready-to-eat foods, such as fruits and vegetables. Cook seafood, meat, poultry, and eggs to the recommended temperature. Get a food thermometer. Store foods at safe temperatures. In general: Keep cold foods at 48F (4.4C) or below. Keep hot foods at 148F (60C) or above. Keep your freezer at [facility] (-17.8C) or below. Foods are not safe to eat if they have been between the temperatures of 40-148F (4.4-60C) for more than 2 hours. What foods should I eat? Fruits Aim to eat 1-2 cups of fresh, canned (in natural juice), or frozen fruits each day. One cup of fruit equals 1 small apple, 1 large banana, 8 large strawberries, 1 cup (237 g) canned fruit,  cup (82 g) dried fruit, or 1 cup (240 mL) 100% juice. Vegetables Aim to eat 2-4 cups of fresh and frozen vegetables each day, including different varieties and colors. One cup of vegetables equals 1 cup (91 g) broccoli or cauliflower florets, 2 medium carrots, 2 cups (150 g) raw, leafy greens, 1 large tomato, 1 large bell pepper, 1 large sweet potato, or 1 medium white potato. Grains Aim to eat 5-10 ounce-equivalents of whole grains each day. Examples of 1 ounce-equivalent of grains include 1 slice of bread, 1 cup (40 g) ready-to-eat cereal, 3 cups (24 g) popcorn, or  cup (93 g) cooked rice. Meats and other proteins Try to eat 5-7 ounce-equivalents of protein each day. Examples of 1 ounce-equivalent of protein include 1 egg,  oz nuts (12 almonds, 24 pistachios, or 7 walnut halves), 1/4 cup (90 g) cooked beans, 6 tablespoons (90 g) hummus or 1 tablespoon (16 g) peanut butter. A cut of meat or fish that is the size of a deck of  cards is about 3-4 ounce-equivalents (85 g). Of the protein you eat each week, try to have at least 8 sounce (227 g) of seafood. This is about 2 servings per week. This includes salmon, trout, herring, sardines, and anchovies. Dairy Aim to eat 3 cup-equivalents of fat-free or low-fat dairy each day. Examples of 1 cup-equivalent of dairy include 1 cup (240 mL) milk, 8 ounces (250 g) yogurt, 1 ounces (44 g) natural cheese, or 1 cup (240 mL) fortified soy milk. Fats and oils Aim for about 5 teaspoons (21 g) of fats and oils per day. Choose monounsaturated fats, such as canola and olive oils, mayonnaise made with olive oil or avocado oil, avocados, peanut butter, and most nuts, or polyunsaturated fats, such as sunflower, corn, and soybean oils, walnuts, pine nuts, sesame seeds, sunflower seeds, and flaxseed. Beverages Aim for 6 eight-ounce glasses of water per day. Limit coffee to 3-5 eight-ounce cups per day. Limit caffeinated beverages that have added calories, such as soda and energy drinks. If you drink alcohol: Limit how much you have to: 0-1 drink a day if you are female. 0-2 drinks a day if you are female. Know how much alcohol is in your drink. In the [place] one drink is one 12 oz bottle of beer (355 mL), one 5 oz glass of wine (  148 mL), or one 1 oz glass of hard liquor (44 mL). Seasoning and other foods Try not to add too much salt to your food. Try using herbs and spices instead of salt. Try not to add sugar to food. This information is based on [place] nutrition guidelines. To learn more, visit DisposableN[employer].be. Exact amounts may vary. You may need different amounts. This information is not intended to replace advice given to you by your health care provider. Make sure you discuss any questions you have with your health care provider. Document Revised: 03/17/2022 Document Reviewed: 03/17/2022 Elsevier Patient Education  2024 [employer].

## 2023-05-12 ENCOUNTER — Encounter: Payer: Self-pay | Admitting: Nurse Practitioner

## 2023-05-12 ENCOUNTER — Ambulatory Visit (INDEPENDENT_AMBULATORY_CARE_PROVIDER_SITE_OTHER): Payer: BC Managed Care – PPO | Admitting: Nurse Practitioner

## 2023-05-12 VITALS — BP 111/69 | HR 64 | Temp 98.4°F | Ht 66.3 in | Wt 205.6 lb

## 2023-05-12 DIAGNOSIS — Z6832 Body mass index (BMI) 32.0-32.9, adult: Secondary | ICD-10-CM | POA: Diagnosis not present

## 2023-05-12 DIAGNOSIS — E7800 Pure hypercholesterolemia, unspecified: Secondary | ICD-10-CM

## 2023-05-12 DIAGNOSIS — K429 Umbilical hernia without obstruction or gangrene: Secondary | ICD-10-CM

## 2023-05-12 DIAGNOSIS — Z0000 Encounter for general adult medical examination without abnormal findings: Secondary | ICD-10-CM | POA: Diagnosis not present

## 2023-05-12 NOTE — Progress Notes (Signed)
BP 111/69   Pulse 64   Temp 98.4 F (36.9 C) (Oral)   Ht 5' 6.3" (1.684 m)   Wt 205 lb 9.6 oz (93.3 kg)   LMP 04/28/2023 (Approximate)   SpO2 97%   BMI 32.89 kg/m    Subjective:    Patient ID: Jaclyn West, female    DOB: 11-17-1970, 52 y.o.   MRN: [id number]  HPI: Jaclyn West is a 52 y.o. female presenting on 05/12/2023 for comprehensive medical examination. Current medical complaints include:none  She currently lives with: husband Menopausal Symptoms: no  -- continues to have menstrual cycles  Is interested in weight loss, is a runner and is frustrated with weight gain as has not been able to run as well.    The 10-year ASCVD risk score (Arnett DK, et al., 2019) is: 1%   Values used to calculate the score:     Age: 33 years     Sex: Female     Is Non-Hispanic African American: No     Diabetic: No     Tobacco smoker: No     Systolic Blood Pressure: 111 mmHg     Is BP treated: No     HDL Cholesterol: 64 mg/dL     Total Cholesterol: 201 mg/dL      29/51/8841    6:60 PM 07/02/2022    2:21 PM 05/06/2022    3:31 PM 04/24/2021    3:40 PM 12/17/2018    9:54 AM  Depression screen PHQ 2/9  Decreased Interest 0 0 0 0 2  Down, Depressed, Hopeless 0 0 0 0 2  PHQ - 2 Score 0 0 0 0 4  Altered sleeping 0 0 1 0 2  Tired, decreased energy 1 0 0 0 3  Change in appetite 0 0 0 0 3  Feeling bad or failure about yourself  0 0 0 0 3  Trouble concentrating 0 0 0 0 2  Moving slowly or fidgety/restless 0 0 0 0 0  Suicidal thoughts 0 0 0 0 0  PHQ-9 Score 1 0 1 0 17  Difficult doing work/chores Not difficult at all Not difficult at all Not difficult at all Not difficult at all Somewhat difficult      05/12/2023    3:42 PM 07/02/2022    2:21 PM 05/06/2022    3:31 PM 04/24/2021    3:40 PM  GAD 7 : Generalized Anxiety Score  Nervous, Anxious, on Edge 0 0 0 0  Control/stop worrying 0 0 0 0  Worry too much - different things 0 0 0 0  Trouble relaxing 0 0 0 0  Restless 0 0 0 0   Easily annoyed or irritable 0 0 0 0  Afraid - awful might happen 0 0 0 0  Total GAD 7 Score 0 0 0 0  Anxiety Difficulty Not difficult at all Not difficult at all Not difficult at all Not difficult at all      12/30/2021    5:49 PM 05/06/2022    3:31 PM 05/06/2022    3:48 PM 07/02/2022    2:21 PM 05/12/2023    3:30 PM  Fall Risk  Falls in the past year?  0 0 0 0  Was there an injury with Fall?  0 0 0 0  Fall Risk Category Calculator  0 0 0 0  Fall Risk Category (Retired)  Low Low Low   (RETIRED) Patient Fall Risk Level Low fall risk Low fall risk  Low fall risk    Patient at Risk for Falls Due to  No Fall Risks No Fall Risks No Fall Risks No Fall Risks  Fall risk Follow up  Falls evaluation completed Falls prevention discussed Falls evaluation completed Falls evaluation completed    Functional Status Survey: Is the patient deaf or have difficulty hearing?: No Does the patient have difficulty seeing, even when wearing glasses/contacts?: No Does the patient have difficulty concentrating, remembering, or making decisions?: No Does the patient have difficulty walking or climbing stairs?: No Does the patient have difficulty dressing or bathing?: No Does the patient have difficulty doing errands alone such as visiting a doctor's office or shopping?: No    Past Medical History:  Past Medical History:  Diagnosis Date   Acne    Depression    Double cervix    Motion sickness    car - back seat   Obesity    Premenopause menorrhagia    Uterus didelphys    Wears contact lenses    Surgical History:  Past Surgical History:  Procedure Laterality Date   CESAREAN SECTION     x 2   COLONOSCOPY WITH PROPOFOL N/A 07/17/2022   Procedure: COLONOSCOPY WITH PROPOFOL;  Surgeon: Toney Reil, MD;  Location: [facility];  Service: Endoscopy;  Laterality: N/A;   POLYPECTOMY  07/17/2022   Procedure: POLYPECTOMY;  Surgeon: Toney Reil, MD;  Location: [facility];  Service:  Endoscopy;;   SKIN LESION EXCISION      Medications:  Current Outpatient Medications on File Prior to Visit  Medication Sig   Melatonin 10 MG TABS Take 10 mg by mouth daily at 2 PM.   No current facility-administered medications on file prior to visit.    Allergies:  Allergies  Allergen Reactions   Iodinated Contrast Media Itching   Red Dye #40 (Allura Red) Itching   Shellfish Allergy Itching and Rash    Lobster    Social History:  Social History   Socioeconomic History   Marital status: Married    Spouse name: Not on file   Number of children: Not on file   Years of education: Not on file   Highest education level: Not on file  Occupational History   Not on file  Tobacco Use   Smoking status: Never   Smokeless tobacco: Never  Vaping Use   Vaping status: Never Used  Substance and Sexual Activity   Alcohol use: No    Alcohol/week: 0.0 standard drinks of alcohol   Drug use: No   Sexual activity: Yes  Other Topics Concern   Not on file  Social History Narrative   Not on file   Social Determinants of Health   Financial Resource Strain: Low Risk  (05/12/2023)   Overall Financial Resource Strain (CARDIA)    Difficulty of Paying Living Expenses: Not hard at all  Food Insecurity: No Food Insecurity (05/12/2023)   Hunger Vital Sign    Worried About Running Out of Food in the Last Year: Never true    Ran Out of Food in the Last Year: Never true  Transportation Needs: No Transportation Needs (05/12/2023)   PRAPARE - Administrator, Civil Service (Medical): No    Lack of Transportation (Non-Medical): No  Physical Activity: Sufficiently Active (05/12/2023)   Exercise Vital Sign    Days of Exercise per Week: 5 days    Minutes of Exercise per Session: 50 min  Stress: No Stress Concern Present (05/12/2023)  [employer] of Occupational Health - Occupational Stress Questionnaire    Feeling of Stress : Not at all  Social Connections: Moderately  Integrated (05/12/2023)   Social Connection and Isolation Panel [NHANES]    Frequency of Communication with Friends and Family: More than three times a week    Frequency of Social Gatherings with Friends and Family: More than three times a week    Attends Religious Services: Never    Database administrator or Organizations: Yes    Attends Engineer, structural: More than 4 times per year    Marital Status: Married  Catering manager Violence: Not At Risk (05/12/2023)   Humiliation, Afraid, Rape, and Kick questionnaire    Fear of Current or Ex-Partner: No    Emotionally Abused: No    Physically Abused: No    Sexually Abused: No   Social History   Tobacco Use  Smoking Status Never  Smokeless Tobacco Never   Social History   Substance and Sexual Activity  Alcohol Use No   Alcohol/week: 0.0 standard drinks of alcohol    Family History:  Family History  Problem Relation Age of Onset   Hypothyroidism Mother    Hypertension Mother    Arthritis Mother    Kidney cancer Mother    Heart disease Father        MI   Heart disease Brother    Brain cancer Brother    Heart disease Brother    Anemia Daughter    Asthma Son    Heart disease Maternal Grandmother        CHF   Stroke Maternal Grandfather    Heart disease Paternal Grandfather     Past medical history, surgical history, medications, allergies, family history and social history reviewed with patient today and changes made to appropriate areas of the chart.   ROS All other ROS negative except what is listed above and in the HPI.      Objective:    BP 111/69   Pulse 64   Temp 98.4 F (36.9 C) (Oral)   Ht 5' 6.3" (1.684 m)   Wt 205 lb 9.6 oz (93.3 kg)   LMP 04/28/2023 (Approximate)   SpO2 97%   BMI 32.89 kg/m   Wt Readings from Last 3 Encounters:  05/12/23 205 lb 9.6 oz (93.3 kg)  12/01/22 196 lb 9.6 oz (89.2 kg)  07/17/22 182 lb 8 oz (82.8 kg)    Physical Exam Vitals and nursing note reviewed.   Constitutional:      General: She is awake. She is not in acute distress.    Appearance: She is well-developed. She is not ill-appearing.  HENT:     Head: Normocephalic and atraumatic.     Right Ear: Hearing, tympanic membrane, ear canal and external ear normal. No drainage.     Left Ear: Hearing, tympanic membrane, ear canal and external ear normal. No drainage.     Nose: Nose normal.     Right Sinus: No maxillary sinus tenderness or frontal sinus tenderness.     Left Sinus: No maxillary sinus tenderness or frontal sinus tenderness.     Mouth/Throat:     Mouth: Mucous membranes are moist.     Pharynx: Oropharynx is clear. Uvula midline. No pharyngeal swelling, oropharyngeal exudate or posterior oropharyngeal erythema.  Eyes:     General: Lids are normal.        Right eye: No discharge.        Left eye: No discharge.  Extraocular Movements: Extraocular movements intact.     Conjunctiva/sclera: Conjunctivae normal.     Pupils: Pupils are equal, round, and reactive to light.     Visual Fields: Right eye visual fields normal and left eye visual fields normal.  Neck:     Thyroid: No thyromegaly.     Vascular: No carotid bruit.     Trachea: Trachea normal.  Cardiovascular:     Rate and Rhythm: Normal rate and regular rhythm.     Heart sounds: Normal heart sounds. No murmur heard.    No gallop.  Pulmonary:     Effort: Pulmonary effort is normal. No accessory muscle usage or respiratory distress.     Breath sounds: Normal breath sounds.  Chest:  Breasts:    Right: Normal.     Left: Normal.  Abdominal:     General: Bowel sounds are normal.     Palpations: Abdomen is soft. There is no hepatomegaly or splenomegaly.     Tenderness: There is no abdominal tenderness.     Hernia: A hernia is present. Hernia is present in the umbilical area.  Musculoskeletal:        General: Normal range of motion.     Cervical back: Normal range of motion and neck supple.     Right lower leg: No  edema.     Left lower leg: No edema.  Lymphadenopathy:     Head:     Right side of head: No submental, submandibular, tonsillar, preauricular or posterior auricular adenopathy.     Left side of head: No submental, submandibular, tonsillar, preauricular or posterior auricular adenopathy.     Cervical: No cervical adenopathy.     Upper Body:     Right upper body: No supraclavicular, axillary or pectoral adenopathy.     Left upper body: No supraclavicular, axillary or pectoral adenopathy.  Skin:    General: Skin is warm and dry.     Capillary Refill: Capillary refill takes less than 2 seconds.     Findings: No rash.  Neurological:     Mental Status: She is alert and oriented to person, place, and time.     Gait: Gait is intact.     Deep Tendon Reflexes: Reflexes are normal and symmetric.     Reflex Scores:      Brachioradialis reflexes are 2+ on the right side and 2+ on the left side.      Patellar reflexes are 2+ on the right side and 2+ on the left side. Psychiatric:        Attention and Perception: Attention normal.        Mood and Affect: Mood normal.        Speech: Speech normal.        Behavior: Behavior normal. Behavior is cooperative.        Thought Content: Thought content normal.        Judgment: Judgment normal.    Results for orders placed or performed in visit on 12/01/22  Microscopic Examination   Urine  Result Value Ref Range   WBC, UA 0-5 0 - 5 /hpf   RBC, Urine 0-2 0 - 2 /hpf   Epithelial Cells (non renal) 0-10 0 - 10 /hpf   Bacteria, UA Moderate (A) None seen/Few  Comp Met (CMET)  Result Value Ref Range   Glucose 80 70 - 99 mg/dL   BUN 27 (H) 6 - 24 mg/dL   Creatinine, Ser 1.61 0.57 - 1.00 mg/dL   eGFR 77 >  59 mL/min/1.73   BUN/Creatinine Ratio 30 (H) 9 - 23   Sodium 139 134 - 144 mmol/L   Potassium 4.4 3.5 - 5.2 mmol/L   Chloride 103 96 - 106 mmol/L   CO2 23 20 - 29 mmol/L   Calcium 9.5 8.7 - 10.2 mg/dL   Total Protein 6.5 6.0 - 8.5 g/dL   Albumin 4.4  3.8 - 4.9 g/dL   Globulin, Total 2.1 1.5 - 4.5 g/dL   Albumin/Globulin Ratio 2.1 1.2 - 2.2   Bilirubin Total 0.3 0.0 - 1.2 mg/dL   Alkaline Phosphatase 50 44 - 121 IU/L   AST 17 0 - 40 IU/L   ALT 26 0 - 32 IU/L  Urinalysis, Routine w reflex microscopic  Result Value Ref Range   Specific Gravity, UA 1.020 1.005 - 1.030   pH, UA 6.0 5.0 - 7.5   Color, UA Yellow Yellow   Appearance Ur Hazy (A) Clear   Leukocytes,UA Negative Negative   Protein,UA Negative Negative/Trace   Glucose, UA Negative Negative   Ketones, UA Negative Negative   RBC, UA Trace (A) Negative   Bilirubin, UA Negative Negative   Urobilinogen, Ur 0.2 0.2 - 1.0 mg/dL   Nitrite, UA Negative Negative   Microscopic Examination See below:   CBC w/Diff  Result Value Ref Range   WBC 8.6 3.4 - 10.8 x10E3/uL   RBC 4.20 3.77 - 5.28 x10E6/uL   Hemoglobin 13.4 11.1 - 15.9 g/dL   Hematocrit 07.3 71.0 - 46.6 %   MCV 95 79 - 97 fL   MCH 31.9 26.6 - 33.0 pg   MCHC 33.5 31.5 - 35.7 g/dL   RDW 62.6 94.8 - 54.6 %   Platelets 275 150 - 450 x10E3/uL   Neutrophils 50 Not Estab. %   Lymphs 40 Not Estab. %   Monocytes 8 Not Estab. %   Eos 1 Not Estab. %   Basos 1 Not Estab. %   Neutrophils Absolute 4.4 1.4 - 7.0 x10E3/uL   Lymphocytes Absolute 3.4 (H) 0.7 - 3.1 x10E3/uL   Monocytes Absolute 0.7 0.1 - 0.9 x10E3/uL   EOS (ABSOLUTE) 0.1 0.0 - 0.4 x10E3/uL   Basophils Absolute 0.1 0.0 - 0.2 x10E3/uL   Immature Granulocytes 0 Not Estab. %   Immature Grans (Abs) 0.0 0.0 - 0.1 x10E3/uL      Assessment & Plan:   Problem List Items Addressed This Visit       Other   Elevated low density lipoprotein (LDL) cholesterol level - Primary (Chronic)   Relevant Orders   Comprehensive metabolic panel   Lipid Panel w/o Chol/HDL Ratio   Other Visit Diagnoses     Encounter for annual physical exam       Annual physical today with labs and health maintenance reviewed, discussed with patient.   Relevant Orders   CBC with  Differential/Platelet   TSH        Follow up plan: Return in about 3 months (around 08/12/2023) for WEIGHT CHECK -- WEGOVY.   LABORATORY TESTING:  - Pap smear: pap done  IMMUNIZATIONS:   - Tdap: Tetanus vaccination status reviewed: last tetanus booster within 10 years, Td vaccination indicated and given today. - Influenza: Up to date - Pneumovax: Not applicable - Prevnar: Not applicable - HPV: Not applicable - Zostavax vaccine: Refused -- wishes to wait  SCREENING: -Mammogram: Up To Date due 06/04/23 - Colonoscopy: Ordered today  - Bone Density: Not applicable  -Hearing Test: Not applicable  -Spirometry: Not applicable  PATIENT COUNSELING:   Advised to take 1 mg of folate supplement per day if capable of pregnancy.   Sexuality: Discussed sexually transmitted diseases, partner selection, use of condoms, avoidance of unintended pregnancy  and contraceptive alternatives.   Advised to avoid cigarette smoking.  I discussed with the patient that most people either abstain from alcohol or drink within safe limits (<=14/week and <=4 drinks/occasion for males, <=7/weeks and <= 3 drinks/occasion for females) and that the risk for alcohol disorders and other health effects rises proportionally with the number of drinks per week and how often a drinker exceeds daily limits.  Discussed cessation/primary prevention of drug use and availability of treatment for abuse.   Diet: Encouraged to adjust caloric intake to maintain  or achieve ideal body weight, to reduce intake of dietary saturated fat and total fat, to limit sodium intake by avoiding high sodium foods and not adding table salt, and to maintain adequate dietary potassium and calcium preferably from fresh fruits, vegetables, and low-fat dairy products.    Stressed the importance of regular exercise  Injury prevention: Discussed safety belts, safety helmets, smoke detector, smoking near bedding or upholstery.   Dental health:  Discussed importance of regular tooth brushing, flossing, and dental visits.    NEXT PREVENTATIVE PHYSICAL DUE IN 1 YEAR. Return in about 3 months (around 08/12/2023) for WEIGHT CHECK -- WEGOVY.

## 2023-05-12 NOTE — Assessment & Plan Note (Signed)
Has gained some weight and is frustrated.  Is interested in starting injectables for weight loss and aware her insurance will not cover this.  No family history of thyroid cancer (MTC, MEN 2, thyroid cell tumors) or pancreatitis.  Will send a paper script for Semaglutide to [facility] which if more affordable option for patient.  Educated her on medication and side effects.  Recommended eating smaller high protein, low fat meals more frequently and exercising 30 mins a day 5 times a week with a goal of 10-15lb weight loss in the next 3 months. Patient voiced their understanding and motivation to adhere to these recommendations.

## 2023-05-12 NOTE — Assessment & Plan Note (Signed)
Small and no symptoms, continue to monitor and if any symptoms present send to general surgery.

## 2023-05-12 NOTE — Assessment & Plan Note (Signed)
Ongoing.  Noted on past labs with ASCVD <7% -- recheck lipid panel today and continue diet /exercise focus. The 10-year ASCVD risk score (Arnett DK, et al., 2019) is: 1%   Values used to calculate the score:     Age: 52 years     Sex: Female     Is Non-Hispanic African American: No     Diabetic: No     Tobacco smoker: No     Systolic Blood Pressure: 111 mmHg     Is BP treated: No     HDL Cholesterol: 64 mg/dL     Total Cholesterol: 201 mg/dL

## 2023-05-13 LAB — CBC WITH DIFFERENTIAL/PLATELET
Basophils Absolute: 0.1 10*3/uL (ref 0.0–0.2)
Basos: 1 %
EOS (ABSOLUTE): 0.1 10*3/uL (ref 0.0–0.4)
Eos: 1 %
Hematocrit: 42.8 % (ref 34.0–46.6)
Hemoglobin: 13.8 g/dL (ref 11.1–15.9)
Immature Grans (Abs): 0 10*3/uL (ref 0.0–0.1)
Immature Granulocytes: 0 %
Lymphocytes Absolute: 3.6 10*3/uL — ABNORMAL HIGH (ref 0.7–3.1)
Lymphs: 33 %
MCH: 31.4 pg (ref 26.6–33.0)
MCHC: 32.2 g/dL (ref 31.5–35.7)
MCV: 98 fL — ABNORMAL HIGH (ref 79–97)
Monocytes Absolute: 0.8 10*3/uL (ref 0.1–0.9)
Monocytes: 7 %
Neutrophils Absolute: 6.2 10*3/uL (ref 1.4–7.0)
Neutrophils: 58 %
Platelets: 295 10*3/uL (ref 150–450)
RBC: 4.39 x10E6/uL (ref 3.77–5.28)
RDW: 11.7 % (ref 11.7–15.4)
WBC: 10.7 10*3/uL (ref 3.4–10.8)

## 2023-05-13 LAB — COMPREHENSIVE METABOLIC PANEL
ALT: 40 [IU]/L — ABNORMAL HIGH (ref 0–32)
AST: 35 [IU]/L (ref 0–40)
Albumin: 4.4 g/dL (ref 3.8–4.9)
Alkaline Phosphatase: 66 [IU]/L (ref 44–121)
BUN/Creatinine Ratio: 24 — ABNORMAL HIGH (ref 9–23)
BUN: 21 mg/dL (ref 6–24)
Bilirubin Total: 0.3 mg/dL (ref 0.0–1.2)
CO2: 22 mmol/L (ref 20–29)
Calcium: 9.8 mg/dL (ref 8.7–10.2)
Chloride: 99 mmol/L (ref 96–106)
Creatinine, Ser: 0.87 mg/dL (ref 0.57–1.00)
Globulin, Total: 2.6 g/dL (ref 1.5–4.5)
Glucose: 87 mg/dL (ref 70–99)
Potassium: 4.5 mmol/L (ref 3.5–5.2)
Sodium: 136 mmol/L (ref 134–144)
Total Protein: 7 g/dL (ref 6.0–8.5)
eGFR: 80 mL/min/{1.73_m2} (ref >59)

## 2023-05-13 LAB — LIPID PANEL W/O CHOL/HDL RATIO
Cholesterol, Total: 251 mg/dL — ABNORMAL HIGH (ref 100–199)
HDL: 79 mg/dL (ref >39)
LDL Chol Calc (NIH): 152 mg/dL — ABNORMAL HIGH (ref 0–99)
Triglycerides: 114 mg/dL (ref 0–149)
VLDL Cholesterol Cal: 20 mg/dL (ref 5–40)

## 2023-05-13 LAB — TSH: TSH: 2.16 u[IU]/mL (ref 0.450–4.500)

## 2023-05-13 NOTE — Progress Notes (Signed)
Contacted via MyChart   Good afternoon [place], your labs have returned and overall are stable. Lymphocytes remain a little elevated, which has been present since 2022. I will continue to watch this and if trend up we may send you to hematology. Liver function, AST and ALT, shows mild elevation in ALT.  Focus on healthy diet choices.  Lipid panel shows levels have trended up a little.  Remainder of labs stable.  Once I get forms from [facility] I will send your script and then they should reach out.  Any questions? Keep being amazing!!  Thank you for allowing me to participate in your care.  I appreciate you. Kindest regards, Joeanthony Seeling

## 2023-05-18 ENCOUNTER — Encounter: Payer: Self-pay | Admitting: Nurse Practitioner

## 2023-06-29 ENCOUNTER — Encounter: Payer: Self-pay | Admitting: Nurse Practitioner

## 2023-07-03 ENCOUNTER — Other Ambulatory Visit: Payer: Self-pay | Admitting: Nurse Practitioner

## 2023-07-03 DIAGNOSIS — Z1231 Encounter for screening mammogram for malignant neoplasm of breast: Secondary | ICD-10-CM

## 2023-07-08 ENCOUNTER — Ambulatory Visit
Admission: RE | Admit: 2023-07-08 | Discharge: 2023-07-08 | Disposition: A | Discharge status: Home / Self Care | Payer: 59 | Source: Ambulatory Visit | Attending: Nurse Practitioner | Admitting: Nurse Practitioner

## 2023-07-08 DIAGNOSIS — Z1231 Encounter for screening mammogram for malignant neoplasm of breast: Secondary | ICD-10-CM

## 2023-07-09 NOTE — Progress Notes (Signed)
 Contacted via MyChart   Normal mammogram, may repeat in one year:)

## 2023-08-09 NOTE — Patient Instructions (Signed)
 Be Involved in Caring For Your Health:  Taking Medications When medications are taken as directed, they can greatly improve your health. But if they are not taken as prescribed, they may not work. In some cases, not taking them correctly can be harmful. To help ensure your treatment remains effective and safe, understand your medications and how to take them. Bring your medications to each visit for review by your provider.  Your lab results, notes, and after visit summary will be available on My Chart. We strongly encourage you to use this feature. If lab results are abnormal the clinic will contact you with the appropriate steps. If the clinic does not contact you assume the results are satisfactory. You can always view your results on My Chart. If you have questions regarding your health or results, please contact the clinic during office hours. You can also ask questions on My Chart.  We at [facility] are grateful that you chose [place] to provide your care. We strive to provide evidence-based and compassionate care and are always looking for feedback. If you get a survey from the clinic please complete this so we can hear your opinions.  Healthy Eating, Adult Healthy eating may help you get and keep a healthy body weight, reduce the risk of chronic disease, and live a long and productive life. It is important to follow a healthy eating pattern. Your nutritional and calorie needs should be met mainly by different nutrient-rich foods. What are tips for following this plan? Reading food labels Read labels and choose the following: Reduced or low sodium products. Juices with 100% fruit juice. Foods with low saturated fats (<3 g per serving) and high polyunsaturated and monounsaturated fats. Foods with whole grains, such as whole wheat, cracked wheat, brown rice, and wild rice. Whole grains that are fortified with folic acid. This is recommended for females who are pregnant or who want  to become pregnant. Read labels and do not eat or drink the following: Foods or drinks with added sugars. These include foods that contain brown sugar, corn sweetener, corn syrup, dextrose, fructose, glucose, high-fructose corn syrup, honey, invert sugar, lactose, malt syrup, maltose, molasses, raw sugar, sucrose, trehalose, or turbinado sugar. Limit your intake of added sugars to less than 10% of your total daily calories. Do not eat more than the following amounts of added sugar per day: 6 teaspoons (25 g) for females. 9 teaspoons (38 g) for males. Foods that contain processed or refined starches and grains. Refined grain products, such as white flour, degermed cornmeal, white bread, and white rice. Shopping Choose nutrient-rich snacks, such as vegetables, whole fruits, and nuts. Avoid high-calorie and high-sugar snacks, such as potato chips, fruit snacks, and candy. Use oil-based dressings and spreads on foods instead of solid fats such as butter, margarine, sour cream, or cream cheese. Limit pre-made sauces, mixes, and "instant" products such as flavored rice, instant noodles, and ready-made pasta. Try more plant-protein sources, such as tofu, tempeh, black beans, edamame, lentils, nuts, and seeds. Explore eating plans such as the Mediterranean diet or vegetarian diet. Try heart-healthy dips made with beans and healthy fats like hummus and guacamole. Vegetables go great with these. Cooking Use oil to saut or stir-fry foods instead of solid fats such as butter, margarine, or lard. Try baking, boiling, grilling, or broiling instead of frying. Remove the fatty part of meats before cooking. Steam vegetables in water or broth. Meal planning  At meals, imagine dividing your plate into fourths: One-half of  your plate is fruits and vegetables. One-fourth of your plate is whole grains. One-fourth of your plate is protein, especially lean meats, poultry, eggs, tofu, beans, or nuts. Include  low-fat dairy as part of your daily diet. Lifestyle Choose healthy options in all settings, including home, work, school, restaurants, or stores. Prepare your food safely: Wash your hands after handling raw meats. Where you prepare food, keep surfaces clean by regularly washing with hot, soapy water. Keep raw meats separate from ready-to-eat foods, such as fruits and vegetables. Cook seafood, meat, poultry, and eggs to the recommended temperature. Get a food thermometer. Store foods at safe temperatures. In general: Keep cold foods at 76F (4.4C) or below. Keep hot foods at 176F (60C) or above. Keep your freezer at [facility] (-17.8C) or below. Foods are not safe to eat if they have been between the temperatures of 40-176F (4.4-60C) for more than 2 hours. What foods should I eat? Fruits Aim to eat 1-2 cups of fresh, canned (in natural juice), or frozen fruits each day. One cup of fruit equals 1 small apple, 1 large banana, 8 large strawberries, 1 cup (237 g) canned fruit,  cup (82 g) dried fruit, or 1 cup (240 mL) 100% juice. Vegetables Aim to eat 2-4 cups of fresh and frozen vegetables each day, including different varieties and colors. One cup of vegetables equals 1 cup (91 g) broccoli or cauliflower florets, 2 medium carrots, 2 cups (150 g) raw, leafy greens, 1 large tomato, 1 large bell pepper, 1 large sweet potato, or 1 medium white potato. Grains Aim to eat 5-10 ounce-equivalents of whole grains each day. Examples of 1 ounce-equivalent of grains include 1 slice of bread, 1 cup (40 g) ready-to-eat cereal, 3 cups (24 g) popcorn, or  cup (93 g) cooked rice. Meats and other proteins Try to eat 5-7 ounce-equivalents of protein each day. Examples of 1 ounce-equivalent of protein include 1 egg,  oz nuts (12 almonds, 24 pistachios, or 7 walnut halves), 1/4 cup (90 g) cooked beans, 6 tablespoons (90 g) hummus or 1 tablespoon (16 g) peanut butter. A cut of meat or fish that is the size of a deck  of cards is about 3-4 ounce-equivalents (85 g). Of the protein you eat each week, try to have at least 8 sounce (227 g) of seafood. This is about 2 servings per week. This includes salmon, trout, herring, sardines, and anchovies. Dairy Aim to eat 3 cup-equivalents of fat-free or low-fat dairy each day. Examples of 1 cup-equivalent of dairy include 1 cup (240 mL) milk, 8 ounces (250 g) yogurt, 1 ounces (44 g) natural cheese, or 1 cup (240 mL) fortified soy milk. Fats and oils Aim for about 5 teaspoons (21 g) of fats and oils per day. Choose monounsaturated fats, such as canola and olive oils, mayonnaise made with olive oil or avocado oil, avocados, peanut butter, and most nuts, or polyunsaturated fats, such as sunflower, corn, and soybean oils, walnuts, pine nuts, sesame seeds, sunflower seeds, and flaxseed. Beverages Aim for 6 eight-ounce glasses of water per day. Limit coffee to 3-5 eight-ounce cups per day. Limit caffeinated beverages that have added calories, such as soda and energy drinks. If you drink alcohol: Limit how much you have to: 0-1 drink a day if you are female. 0-2 drinks a day if you are female. Know how much alcohol is in your drink. In the [place] one drink is one 12 oz bottle of beer (355 mL), one 5 oz glass of wine (  148 mL), or one 1 oz glass of hard liquor (44 mL). Seasoning and other foods Try not to add too much salt to your food. Try using herbs and spices instead of salt. Try not to add sugar to food. This information is based on [place] nutrition guidelines. To learn more, visit DisposableNylon.be. Exact amounts may vary. You may need different amounts. This information is not intended to replace advice given to you by your health care provider. Make sure you discuss any questions you have with your health care provider. Document Revised: 03/17/2022 Document Reviewed: 03/17/2022 Elsevier Patient Education  2024 [employer].

## 2023-08-12 ENCOUNTER — Encounter: Payer: Self-pay | Admitting: Nurse Practitioner

## 2023-08-12 ENCOUNTER — Ambulatory Visit: Payer: 59 | Admitting: Nurse Practitioner

## 2023-08-12 VITALS — BP 138/78 | HR 86 | Temp 98.4°F | Ht 66.0 in | Wt 184.2 lb

## 2023-08-12 DIAGNOSIS — Z6832 Body mass index (BMI) 32.0-32.9, adult: Secondary | ICD-10-CM

## 2023-08-12 DIAGNOSIS — E7800 Pure hypercholesterolemia, unspecified: Secondary | ICD-10-CM

## 2023-08-12 NOTE — Assessment & Plan Note (Signed)
On Wegovy and tolerating well with 21 lbs lost thus far.  Will continue current dosing and adjust as needed.  No family history of thyroid cancer (MTC, MEN 2, thyroid cell tumors) or pancreatitis.   Educated her on medication and side effects.  Recommended eating smaller high protein, low fat meals more frequently and exercising 30 mins a day 5 times a week with a goal of 10-15lb weight loss in the next 3 months. Patient voiced their understanding and motivation to adhere to these recommendations. She is very active at baseline and focuses on diet, which are beneficial.

## 2023-08-12 NOTE — Assessment & Plan Note (Signed)
Ongoing.  Noted on past labs with ASCVD <7% -- recheck lipid panel at physical and continue diet /exercise focus. The 10-year ASCVD risk score (Arnett DK, et al., 2019) is: 1.5%   Values used to calculate the score:     Age: 53 years     Sex: Female     Is Non-Hispanic African American: No     Diabetic: No     Tobacco smoker: No     Systolic Blood Pressure: 138 mmHg     Is BP treated: No     HDL Cholesterol: 79 mg/dL     Total Cholesterol: 251 mg/dL

## 2023-08-12 NOTE — Progress Notes (Signed)
BP 138/78   Pulse 86   Temp 98.4 F (36.9 C) (Oral)   Ht 5\' 6"  (1.676 m)   Wt 184 lb 3.2 oz (83.6 kg)   LMP 08/04/2023 (Approximate)   SpO2 98%   BMI 29.73 kg/m    Subjective:    Patient ID: Jaclyn West, female    DOB: 1971-01-30, 53 y.o.   MRN: [id number]  HPI: ARY RUDNICK is a 53 y.o. female  Chief Complaint  Patient presents with   Weight Check   WEIGHT GAIN Presents today for weight follow-up.  Started [facility] 07/04/23.  Weight at time was 205 lbs.  Has lost 21 lbs.  Is paying out of pocket for this, [facility].  No ADR with this.  She is running and exercising.  Portion size has reduced with this.   Duration: years Previous attempts at weight loss: yes Complications of obesity: none Peak weight: 205 lbs Weight loss goal: 150 lbs Weight loss to date: 21 lbs Requesting obesity pharmacotherapy: yes Current weight loss supplements/medications: yes Previous weight loss supplements/meds: no Calories:  2000  Relevant past medical, surgical, family and social history reviewed and updated as indicated. Interim medical history since our last visit reviewed. Allergies and medications reviewed and updated.  Review of Systems  Constitutional:  Negative for activity change, appetite change, diaphoresis, fatigue and fever.  Respiratory:  Negative for cough, chest tightness and shortness of breath.   Cardiovascular:  Negative for chest pain, palpitations and leg swelling.  Gastrointestinal: Negative.   Neurological:  Negative for dizziness, syncope, weakness, light-headedness, numbness and headaches.  Psychiatric/Behavioral: Negative.      Per HPI unless specifically indicated above     Objective:    BP 138/78   Pulse 86   Temp 98.4 F (36.9 C) (Oral)   Ht 5\' 6"  (1.676 m)   Wt 184 lb 3.2 oz (83.6 kg)   LMP 08/04/2023 (Approximate)   SpO2 98%   BMI 29.73 kg/m   Wt Readings from Last 3 Encounters:  08/12/23 184 lb 3.2 oz (83.6 kg)  05/12/23 205 lb 9.6 oz  (93.3 kg)  12/01/22 196 lb 9.6 oz (89.2 kg)    Physical Exam Vitals and nursing note reviewed.  Constitutional:      General: She is awake. She is not in acute distress.    Appearance: She is well-developed and well-groomed. She is not ill-appearing or toxic-appearing.  HENT:     Head: Normocephalic.     Right Ear: Hearing and external ear normal.     Left Ear: Hearing and external ear normal.  Eyes:     General: Lids are normal.        Right eye: No discharge.        Left eye: No discharge.     Conjunctiva/sclera: Conjunctivae normal.     Pupils: Pupils are equal, round, and reactive to light.  Neck:     Thyroid: No thyromegaly.     Vascular: No carotid bruit.  Cardiovascular:     Rate and Rhythm: Normal rate and regular rhythm.     Heart sounds: Normal heart sounds. No murmur heard.    No gallop.  Pulmonary:     Effort: Pulmonary effort is normal. No accessory muscle usage or respiratory distress.     Breath sounds: Normal breath sounds.  Abdominal:     General: Bowel sounds are normal. There is no distension.     Palpations: Abdomen is soft.     Tenderness:  There is no abdominal tenderness.  Musculoskeletal:     Cervical back: Normal range of motion and neck supple.     Right lower leg: No edema.     Left lower leg: No edema.  Lymphadenopathy:     Cervical: No cervical adenopathy.  Skin:    General: Skin is warm and dry.  Neurological:     Mental Status: She is alert and oriented to person, place, and time.     Deep Tendon Reflexes: Reflexes are normal and symmetric.     Reflex Scores:      Brachioradialis reflexes are 2+ on the right side and 2+ on the left side.      Patellar reflexes are 2+ on the right side and 2+ on the left side. Psychiatric:        Attention and Perception: Attention normal.        Mood and Affect: Mood normal.        Speech: Speech normal.        Behavior: Behavior normal. Behavior is cooperative.        Thought Content: Thought content  normal.     Results for orders placed or performed in visit on 05/12/23  CBC with Differential/Platelet   Collection Time: 05/12/23  4:19 PM  Result Value Ref Range   WBC 10.7 3.4 - 10.8 x10E3/uL   RBC 4.39 3.77 - 5.28 x10E6/uL   Hemoglobin 13.8 11.1 - 15.9 g/dL   Hematocrit 16.1 09.6 - 46.6 %   MCV 98 (H) 79 - 97 fL   MCH 31.4 26.6 - 33.0 pg   MCHC 32.2 31.5 - 35.7 g/dL   RDW 04.5 40.9 - 81.1 %   Platelets 295 150 - 450 x10E3/uL   Neutrophils 58 Not Estab. %   Lymphs 33 Not Estab. %   Monocytes 7 Not Estab. %   Eos 1 Not Estab. %   Basos 1 Not Estab. %   Neutrophils Absolute 6.2 1.4 - 7.0 x10E3/uL   Lymphocytes Absolute 3.6 (H) 0.7 - 3.1 x10E3/uL   Monocytes Absolute 0.8 0.1 - 0.9 x10E3/uL   EOS (ABSOLUTE) 0.1 0.0 - 0.4 x10E3/uL   Basophils Absolute 0.1 0.0 - 0.2 x10E3/uL   Immature Granulocytes 0 Not Estab. %   Immature Grans (Abs) 0.0 0.0 - 0.1 x10E3/uL  Comprehensive metabolic panel   Collection Time: 05/12/23  4:19 PM  Result Value Ref Range   Glucose 87 70 - 99 mg/dL   BUN 21 6 - 24 mg/dL   Creatinine, Ser 9.14 0.57 - 1.00 mg/dL   eGFR 80 >78 GN/FAO/1.30   BUN/Creatinine Ratio 24 (H) 9 - 23   Sodium 136 134 - 144 mmol/L   Potassium 4.5 3.5 - 5.2 mmol/L   Chloride 99 96 - 106 mmol/L   CO2 22 20 - 29 mmol/L   Calcium 9.8 8.7 - 10.2 mg/dL   Total Protein 7.0 6.0 - 8.5 g/dL   Albumin 4.4 3.8 - 4.9 g/dL   Globulin, Total 2.6 1.5 - 4.5 g/dL   Bilirubin Total 0.3 0.0 - 1.2 mg/dL   Alkaline Phosphatase 66 44 - 121 IU/L   AST 35 0 - 40 IU/L   ALT 40 (H) 0 - 32 IU/L  TSH   Collection Time: 05/12/23  4:19 PM  Result Value Ref Range   TSH 2.160 0.450 - 4.500 uIU/mL  Lipid Panel w/o Chol/HDL Ratio   Collection Time: 05/12/23  4:19 PM  Result Value Ref Range   Cholesterol,  Total 251 (H) 100 - 199 mg/dL   Triglycerides 409 0 - 149 mg/dL   HDL 79 >81 mg/dL   VLDL Cholesterol Cal 20 5 - 40 mg/dL   LDL Chol Calc (NIH) 191 (H) 0 - 99 mg/dL      Assessment & Plan:    Problem List Items Addressed This Visit       Other   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  Noted on past labs with ASCVD <7% -- recheck lipid panel at physical and continue diet /exercise focus. The 10-year ASCVD risk score (Arnett DK, et al., 2019) is: 1.5%   Values used to calculate the score:     Age: 20 years     Sex: Female     Is Non-Hispanic African American: No     Diabetic: No     Tobacco smoker: No     Systolic Blood Pressure: 138 mmHg     Is BP treated: No     HDL Cholesterol: 79 mg/dL     Total Cholesterol: 251 mg/dL       BMI 47.8-29.5,AOZHY   On Wegovy and tolerating well with 21 lbs lost thus far.  Will continue current dosing and adjust as needed.  No family history of thyroid cancer (MTC, MEN 2, thyroid cell tumors) or pancreatitis.   Educated her on medication and side effects.  Recommended eating smaller high protein, low fat meals more frequently and exercising 30 mins a day 5 times a week with a goal of 10-15lb weight loss in the next 3 months. Patient voiced their understanding and motivation to adhere to these recommendations. She is very active at baseline and focuses on diet, which are beneficial.            Follow up plan: Return in about 8 weeks (around 10/07/2023) for WEIGHT CHECK.

## 2023-09-03 ENCOUNTER — Other Ambulatory Visit: Payer: Self-pay | Admitting: Nurse Practitioner

## 2023-09-04 ENCOUNTER — Other Ambulatory Visit: Payer: Self-pay | Admitting: Nurse Practitioner

## 2023-09-04 MED ORDER — WEGOVY 1 MG/0.5ML ~~LOC~~ SOAJ: 1.0000 mg | SUBCUTANEOUS | 1 refills | Status: DC

## 2023-09-04 NOTE — Telephone Encounter (Signed)
 Requested medication (s) are due for refill today: Yes  Requested medication (s) are on the active medication list: Yes  Last refill:  08/12/23   Future visit scheduled: Yes  Notes to clinic:  Unable to refill per protocol, last refill by another provider.      Requested Prescriptions  Pending Prescriptions Disp Refills   Semaglutide-Weight Management (WEGOVY) 1 MG/0.5ML SOAJ [Pharmacy Med Name: SEMAGLUTIDE1MG /MLINJ(5)] 4 mL 1    Sig: INJECT 100 UNITS (1.0MG ) SUBCUTANEOUSLY ONCE WEEKLY FOR 4 WEEKS     Endocrinology:  Diabetes - GLP-1 Receptor Agonists - semaglutide Failed - 09/04/2023 11:22 AM      Failed - HBA1C in normal range and within 180 days    No results found for: "HGBA1C", "LABA1C"       Passed - Cr in normal range and within 360 days    Creatinine  Date Value Ref Range Status  05/13/2012 0.98 0.60 - 1.30 mg/dL Final   Creatinine, Ser  Date Value Ref Range Status  05/12/2023 0.87 0.57 - 1.00 mg/dL Final         Passed - Valid encounter within last 6 months    Recent Outpatient Visits           3 months ago Elevated low density lipoprotein (LDL) cholesterol level   [place] [facility] [place], Manuelito T, NP   9 months ago Dizziness   [place] [facility] Larae Grooms, NP   1 year ago Stress incontinence, female   [place] [facility] [place], [place] T, NP   1 year ago Elevated low density lipoprotein (LDL) cholesterol level   [place] [facility] [place], [place] T, NP   2 years ago Elevated low density lipoprotein (LDL) cholesterol level   [place] [facility] [place], Dorie Rank, NP       Future Appointments             In 1 month Cann[place], Dorie Rank, NP [place] [facility], [facility]   In 1 month [place], Dorie Rank, NP [place] [facility], [facility]

## 2023-09-07 NOTE — Telephone Encounter (Signed)
 Requested medication (s) are due for refill today: routing for review  Requested medication (s) are on the active medication list: yes  Last refill:  09/04/23  Future visit scheduled: yes  Notes to clinic:  Pharmacy comment: OK TO CMPD? PLEASE CALL AND APPROVE OR SEND NEW RX WITH NOTE "OK TO CMPD" CANNOT GET MANUFACTURED VERSION!      Requested Prescriptions  Pending Prescriptions Disp Refills   WEGOVY 1 MG/0.5ML SOAJ [Pharmacy Med Name: WEGOVY INJ 1MG ] 5 mL      Endocrinology:  Diabetes - GLP-1 Receptor Agonists - semaglutide Failed - 09/07/2023  8:24 AM      Failed - HBA1C in normal range and within 180 days    No results found for: "HGBA1C", "LABA1C"       Passed - Cr in normal range and within 360 days    Creatinine  Date Value Ref Range Status  05/13/2012 0.98 0.60 - 1.30 mg/dL Final   Creatinine, Ser  Date Value Ref Range Status  05/12/2023 0.87 0.57 - 1.00 mg/dL Final         Passed - Valid encounter within last 6 months    Recent Outpatient Visits           3 months ago Elevated low density lipoprotein (LDL) cholesterol level   [place] [facility] [place], Dennis T, NP   9 months ago Dizziness   [place] [facility] Larae Grooms, NP   1 year ago Stress incontinence, female   [place] [facility] [place], [place] T, NP   1 year ago Elevated low density lipoprotein (LDL) cholesterol level   [place] [facility] [place], Lamont T, NP   2 years ago Elevated low density lipoprotein (LDL) cholesterol level   [place] [facility] [place], Dorie Rank, NP       Future Appointments             In 1 month Cannady, Dorie Rank, NP [facility], [facility]   In 1 month [place], Dorie Rank, NP [place] [facility], [facility]

## 2023-09-27 ENCOUNTER — Encounter: Payer: Self-pay | Admitting: Nurse Practitioner

## 2023-09-28 MED ORDER — WEGOVY 1 MG/0.5ML ~~LOC~~ SOAJ: 1.0000 mg | SUBCUTANEOUS | 3 refills | Status: DC

## 2023-10-13 ENCOUNTER — Ambulatory Visit: Payer: 59 | Admitting: Nurse Practitioner

## 2023-10-18 NOTE — Patient Instructions (Signed)

## 2023-10-21 ENCOUNTER — Ambulatory Visit: Payer: 59 | Admitting: Nurse Practitioner

## 2023-10-21 ENCOUNTER — Encounter: Payer: Self-pay | Admitting: Nurse Practitioner

## 2023-10-21 VITALS — BP 121/73 | HR 61 | Temp 98.4°F | Ht 66.0 in | Wt 168.2 lb

## 2023-10-21 DIAGNOSIS — Z6832 Body mass index (BMI) 32.0-32.9, adult: Secondary | ICD-10-CM

## 2023-10-21 DIAGNOSIS — E7800 Pure hypercholesterolemia, unspecified: Secondary | ICD-10-CM | POA: Diagnosis not present

## 2023-10-21 NOTE — Assessment & Plan Note (Signed)
 Ongoing.  Noted on past labs with ASCVD <7% -- recheck lipid panel at physical and continue diet /exercise focus. The 10-year ASCVD risk score (Arnett DK, et al., 2019) is: 1.2%   Values used to calculate the score:     Age: 53 years     Sex: Female     Is Non-Hispanic African American: No     Diabetic: No     Tobacco smoker: No     Systolic Blood Pressure: 121 mmHg     Is BP treated: No     HDL Cholesterol: 79 mg/dL     Total Cholesterol: 251 mg/dL

## 2023-10-21 NOTE — Assessment & Plan Note (Signed)
 On Wegovy  and tolerating well with 37 lbs lost thus far.  Will continue current dosing and adjust as needed, send refills when requested.  No family history of thyroid cancer (MTC, MEN 2, thyroid cell tumors) or pancreatitis.  Educated her on medication and side effects.  Recommended eating smaller high protein, low fat meals more frequently and exercising 30 mins a day 5 times a week with a goal of 10-15lb weight loss in the next 3 months. Patient voiced their understanding and motivation to adhere to these recommendations. She is very active at baseline and focuses on diet, which are beneficial.

## 2023-10-21 NOTE — Progress Notes (Signed)
 BP 121/73   Pulse 61   Temp 98.4 F (36.9 C) (Oral)   Ht 5\' 6"  (1.676 m)   Wt 168 lb 3.2 oz (76.3 kg)   SpO2 98%   BMI 27.15 kg/m    Subjective:    Patient ID: Jaclyn West, female    DOB: May 01, 1971, 53 y.o.   MRN: [id number]  HPI: Jaclyn West is a 53 y.o. female  Chief Complaint  Patient presents with   Weight Check   WEIGHT GAIN Presents today for weight follow-up.  Started Wegovy  07/04/23.  Weight at time was 205 lbs.  Has lost 37 lbs total, 16 lbs since last visit.  Is paying out of pocket for this, [facility].  No ADR with this.  Is running and exercising per her usual routine, did a 25k recently and finished 30 minutes faster than last year.  She does endorse reduced appetite. Duration: years Previous attempts at weight loss: yes Complications of obesity: none Peak weight: 205 lbs Weight loss goal: 150 lbs Weight loss to date: 37 lbs Requesting obesity pharmacotherapy: yes Current weight loss supplements/medications: yes Previous weight loss supplements/meds: no Calories:  2000  Relevant past medical, surgical, family and social history reviewed and updated as indicated. Interim medical history since our last visit reviewed. Allergies and medications reviewed and updated.  Review of Systems  Constitutional:  Negative for activity change, appetite change, diaphoresis, fatigue and fever.  Respiratory:  Negative for cough, chest tightness and shortness of breath.   Cardiovascular:  Negative for chest pain, palpitations and leg swelling.  Gastrointestinal: Negative.   Neurological:  Negative for dizziness, syncope, weakness, light-headedness, numbness and headaches.  Psychiatric/Behavioral: Negative.      Per HPI unless specifically indicated above     Objective:    BP 121/73   Pulse 61   Temp 98.4 F (36.9 C) (Oral)   Ht 5\' 6"  (1.676 m)   Wt 168 lb 3.2 oz (76.3 kg)   SpO2 98%   BMI 27.15 kg/m   Wt Readings from Last 3 Encounters:  10/21/23 168  lb 3.2 oz (76.3 kg)  08/12/23 184 lb 3.2 oz (83.6 kg)  05/12/23 205 lb 9.6 oz (93.3 kg)    Physical Exam Vitals and nursing note reviewed.  Constitutional:      General: She is awake. She is not in acute distress.    Appearance: She is well-developed and well-groomed. She is not ill-appearing or toxic-appearing.  HENT:     Head: Normocephalic.     Right Ear: Hearing and external ear normal.     Left Ear: Hearing and external ear normal.  Eyes:     General: Lids are normal.        Right eye: No discharge.        Left eye: No discharge.     Conjunctiva/sclera: Conjunctivae normal.     Pupils: Pupils are equal, round, and reactive to light.  Neck:     Thyroid: No thyromegaly.     Vascular: No carotid bruit.  Cardiovascular:     Rate and Rhythm: Normal rate and regular rhythm.     Heart sounds: Normal heart sounds. No murmur heard.    No gallop.  Pulmonary:     Effort: Pulmonary effort is normal. No accessory muscle usage or respiratory distress.     Breath sounds: Normal breath sounds.  Abdominal:     General: Bowel sounds are normal. There is no distension.     Palpations:  Abdomen is soft.     Tenderness: There is no abdominal tenderness.  Musculoskeletal:     Cervical back: Normal range of motion and neck supple.     Right lower leg: No edema.     Left lower leg: No edema.  Lymphadenopathy:     Cervical: No cervical adenopathy.  Skin:    General: Skin is warm and dry.  Neurological:     Mental Status: She is alert and oriented to person, place, and time.     Deep Tendon Reflexes: Reflexes are normal and symmetric.     Reflex Scores:      Brachioradialis reflexes are 2+ on the right side and 2+ on the left side.      Patellar reflexes are 2+ on the right side and 2+ on the left side. Psychiatric:        Attention and Perception: Attention normal.        Mood and Affect: Mood normal.        Speech: Speech normal.        Behavior: Behavior normal. Behavior is  cooperative.        Thought Content: Thought content normal.    Results for orders placed or performed in visit on 05/12/23  CBC with Differential/Platelet   Collection Time: 05/12/23  4:19 PM  Result Value Ref Range   WBC 10.7 3.4 - 10.8 x10E3/uL   RBC 4.39 3.77 - 5.28 x10E6/uL   Hemoglobin 13.8 11.1 - 15.9 g/dL   Hematocrit 16.1 09.6 - 46.6 %   MCV 98 (H) 79 - 97 fL   MCH 31.4 26.6 - 33.0 pg   MCHC 32.2 31.5 - 35.7 g/dL   RDW 04.5 40.9 - 81.1 %   Platelets 295 150 - 450 x10E3/uL   Neutrophils 58 Not Estab. %   Lymphs 33 Not Estab. %   Monocytes 7 Not Estab. %   Eos 1 Not Estab. %   Basos 1 Not Estab. %   Neutrophils Absolute 6.2 1.4 - 7.0 x10E3/uL   Lymphocytes Absolute 3.6 (H) 0.7 - 3.1 x10E3/uL   Monocytes Absolute 0.8 0.1 - 0.9 x10E3/uL   EOS (ABSOLUTE) 0.1 0.0 - 0.4 x10E3/uL   Basophils Absolute 0.1 0.0 - 0.2 x10E3/uL   Immature Granulocytes 0 Not Estab. %   Immature Grans (Abs) 0.0 0.0 - 0.1 x10E3/uL  Comprehensive metabolic panel   Collection Time: 05/12/23  4:19 PM  Result Value Ref Range   Glucose 87 70 - 99 mg/dL   BUN 21 6 - 24 mg/dL   Creatinine, Ser 9.14 0.57 - 1.00 mg/dL   eGFR 80 >78 GN/FAO/1.30   BUN/Creatinine Ratio 24 (H) 9 - 23   Sodium 136 134 - 144 mmol/L   Potassium 4.5 3.5 - 5.2 mmol/L   Chloride 99 96 - 106 mmol/L   CO2 22 20 - 29 mmol/L   Calcium 9.8 8.7 - 10.2 mg/dL   Total Protein 7.0 6.0 - 8.5 g/dL   Albumin 4.4 3.8 - 4.9 g/dL   Globulin, Total 2.6 1.5 - 4.5 g/dL   Bilirubin Total 0.3 0.0 - 1.2 mg/dL   Alkaline Phosphatase 66 44 - 121 IU/L   AST 35 0 - 40 IU/L   ALT 40 (H) 0 - 32 IU/L  TSH   Collection Time: 05/12/23  4:19 PM  Result Value Ref Range   TSH 2.160 0.450 - 4.500 uIU/mL  Lipid Panel w/o Chol/HDL Ratio   Collection Time: 05/12/23  4:19 PM  Result Value Ref Range   Cholesterol, Total 251 (H) 100 - 199 mg/dL   Triglycerides 161 0 - 149 mg/dL   HDL 79 >09 mg/dL   VLDL Cholesterol Cal 20 5 - 40 mg/dL   LDL Chol Calc (NIH)  152 (H) 0 - 99 mg/dL      Assessment & Plan:   Problem List Items Addressed This Visit       Other   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  Noted on past labs with ASCVD <7% -- recheck lipid panel at physical and continue diet /exercise focus. The 10-year ASCVD risk score (Arnett DK, et al., 2019) is: 1.2%   Values used to calculate the score:     Age: 25 years     Sex: Female     Is Non-Hispanic African American: No     Diabetic: No     Tobacco smoker: No     Systolic Blood Pressure: 121 mmHg     Is BP treated: No     HDL Cholesterol: 79 mg/dL     Total Cholesterol: 251 mg/dL       BMI 60.4-54.0,JWJXB   On Wegovy  and tolerating well with 37 lbs lost thus far.  Will continue current dosing and adjust as needed, send refills when requested.  No family history of thyroid cancer (MTC, MEN 2, thyroid cell tumors) or pancreatitis.  Educated her on medication and side effects.  Recommended eating smaller high protein, low fat meals more frequently and exercising 30 mins a day 5 times a week with a goal of 10-15lb weight loss in the next 3 months. Patient voiced their understanding and motivation to adhere to these recommendations. She is very active at baseline and focuses on diet, which are beneficial.             Follow up plan: Return in about 6 weeks (around 12/02/2023) for WEIGHT CHECK.

## 2023-12-06 NOTE — Patient Instructions (Signed)
 Be Involved in Caring For Your Health:  Taking Medications When medications are taken as directed, they can greatly improve your health. But if they are not taken as prescribed, they may not work. In some cases, not taking them correctly can be harmful. To help ensure your treatment remains effective and safe, understand your medications and how to take them. Bring your medications to each visit for review by your provider.  Your lab results, notes, and after visit summary will be available on My Chart. We strongly encourage you to use this feature. If lab results are abnormal the clinic will contact you with the appropriate steps. If the clinic does not contact you assume the results are satisfactory. You can always view your results on My Chart. If you have questions regarding your health or results, please contact the clinic during office hours. You can also ask questions on My Chart.  We at [facility] are grateful that you chose [place] to provide your care. We strive to provide evidence-based and compassionate care and are always looking for feedback. If you get a survey from the clinic please complete this so we can hear your opinions.  Healthy Eating, Adult Healthy eating may help you get and keep a healthy body weight, reduce the risk of chronic disease, and live a long and productive life. It is important to follow a healthy eating pattern. Your nutritional and calorie needs should be met mainly by different nutrient-rich foods. What are tips for following this plan? Reading food labels Read labels and choose the following: Reduced or low sodium products. Juices with 100% fruit juice. Foods with low saturated fats (<3 g per serving) and high polyunsaturated and monounsaturated fats. Foods with whole grains, such as whole wheat, cracked wheat, brown rice, and wild rice. Whole grains that are fortified with folic acid. This is recommended for females who are pregnant or who want  to become pregnant. Read labels and do not eat or drink the following: Foods or drinks with added sugars. These include foods that contain brown sugar, corn sweetener, corn syrup, dextrose, fructose, glucose, high-fructose corn syrup, honey, invert sugar, lactose, malt syrup, maltose, molasses, raw sugar, sucrose, trehalose, or turbinado sugar. Limit your intake of added sugars to less than 10% of your total daily calories. Do not eat more than the following amounts of added sugar per day: 6 teaspoons (25 g) for females. 9 teaspoons (38 g) for males. Foods that contain processed or refined starches and grains. Refined grain products, such as white flour, degermed cornmeal, white bread, and white rice. Shopping Choose nutrient-rich snacks, such as vegetables, whole fruits, and nuts. Avoid high-calorie and high-sugar snacks, such as potato chips, fruit snacks, and candy. Use oil-based dressings and spreads on foods instead of solid fats such as butter, margarine, sour cream, or cream cheese. Limit pre-made sauces, mixes, and "instant" products such as flavored rice, instant noodles, and ready-made pasta. Try more plant-protein sources, such as tofu, tempeh, black beans, edamame, lentils, nuts, and seeds. Explore eating plans such as the Mediterranean diet or vegetarian diet. Try heart-healthy dips made with beans and healthy fats like hummus and guacamole. Vegetables go great with these. Cooking Use oil to saut or stir-fry foods instead of solid fats such as butter, margarine, or lard. Try baking, boiling, grilling, or broiling instead of frying. Remove the fatty part of meats before cooking. Steam vegetables in water or broth. Meal planning  At meals, imagine dividing your plate into fourths: One-half of  your plate is fruits and vegetables. One-fourth of your plate is whole grains. One-fourth of your plate is protein, especially lean meats, poultry, eggs, tofu, beans, or nuts. Include  low-fat dairy as part of your daily diet. Lifestyle Choose healthy options in all settings, including home, work, school, restaurants, or stores. Prepare your food safely: Wash your hands after handling raw meats. Where you prepare food, keep surfaces clean by regularly washing with hot, soapy water. Keep raw meats separate from ready-to-eat foods, such as fruits and vegetables. Cook seafood, meat, poultry, and eggs to the recommended temperature. Get a food thermometer. Store foods at safe temperatures. In general: Keep cold foods at 76F (4.4C) or below. Keep hot foods at 176F (60C) or above. Keep your freezer at [facility] (-17.8C) or below. Foods are not safe to eat if they have been between the temperatures of 40-176F (4.4-60C) for more than 2 hours. What foods should I eat? Fruits Aim to eat 1-2 cups of fresh, canned (in natural juice), or frozen fruits each day. One cup of fruit equals 1 small apple, 1 large banana, 8 large strawberries, 1 cup (237 g) canned fruit,  cup (82 g) dried fruit, or 1 cup (240 mL) 100% juice. Vegetables Aim to eat 2-4 cups of fresh and frozen vegetables each day, including different varieties and colors. One cup of vegetables equals 1 cup (91 g) broccoli or cauliflower florets, 2 medium carrots, 2 cups (150 g) raw, leafy greens, 1 large tomato, 1 large bell pepper, 1 large sweet potato, or 1 medium white potato. Grains Aim to eat 5-10 ounce-equivalents of whole grains each day. Examples of 1 ounce-equivalent of grains include 1 slice of bread, 1 cup (40 g) ready-to-eat cereal, 3 cups (24 g) popcorn, or  cup (93 g) cooked rice. Meats and other proteins Try to eat 5-7 ounce-equivalents of protein each day. Examples of 1 ounce-equivalent of protein include 1 egg,  oz nuts (12 almonds, 24 pistachios, or 7 walnut halves), 1/4 cup (90 g) cooked beans, 6 tablespoons (90 g) hummus or 1 tablespoon (16 g) peanut butter. A cut of meat or fish that is the size of a deck  of cards is about 3-4 ounce-equivalents (85 g). Of the protein you eat each week, try to have at least 8 sounce (227 g) of seafood. This is about 2 servings per week. This includes salmon, trout, herring, sardines, and anchovies. Dairy Aim to eat 3 cup-equivalents of fat-free or low-fat dairy each day. Examples of 1 cup-equivalent of dairy include 1 cup (240 mL) milk, 8 ounces (250 g) yogurt, 1 ounces (44 g) natural cheese, or 1 cup (240 mL) fortified soy milk. Fats and oils Aim for about 5 teaspoons (21 g) of fats and oils per day. Choose monounsaturated fats, such as canola and olive oils, mayonnaise made with olive oil or avocado oil, avocados, peanut butter, and most nuts, or polyunsaturated fats, such as sunflower, corn, and soybean oils, walnuts, pine nuts, sesame seeds, sunflower seeds, and flaxseed. Beverages Aim for 6 eight-ounce glasses of water per day. Limit coffee to 3-5 eight-ounce cups per day. Limit caffeinated beverages that have added calories, such as soda and energy drinks. If you drink alcohol: Limit how much you have to: 0-1 drink a day if you are female. 0-2 drinks a day if you are female. Know how much alcohol is in your drink. In the [place] one drink is one 12 oz bottle of beer (355 mL), one 5 oz glass of wine (  148 mL), or one 1 oz glass of hard liquor (44 mL). Seasoning and other foods Try not to add too much salt to your food. Try using herbs and spices instead of salt. Try not to add sugar to food. This information is based on [place] nutrition guidelines. To learn more, visit DisposableNylon.be. Exact amounts may vary. You may need different amounts. This information is not intended to replace advice given to you by your health care provider. Make sure you discuss any questions you have with your health care provider. Document Revised: 03/17/2022 Document Reviewed: 03/17/2022 Elsevier Patient Education  2024 [employer].

## 2023-12-08 ENCOUNTER — Ambulatory Visit: Admitting: Nurse Practitioner

## 2023-12-08 ENCOUNTER — Encounter: Payer: Self-pay | Admitting: Nurse Practitioner

## 2023-12-08 VITALS — BP 94/63 | HR 75 | Temp 97.9°F | Ht 66.1 in | Wt 161.0 lb

## 2023-12-08 DIAGNOSIS — Z6832 Body mass index (BMI) 32.0-32.9, adult: Secondary | ICD-10-CM | POA: Diagnosis not present

## 2023-12-08 DIAGNOSIS — M25552 Pain in left hip: Secondary | ICD-10-CM | POA: Insufficient documentation

## 2023-12-08 DIAGNOSIS — E7800 Pure hypercholesterolemia, unspecified: Secondary | ICD-10-CM | POA: Diagnosis not present

## 2023-12-08 NOTE — Progress Notes (Signed)
 BP 94/63   Pulse 75   Temp 97.9 F (36.6 C) (Oral)   Ht 5' 6.1" (1.679 m)   Wt 161 lb (73 kg)   SpO2 97%   BMI 25.91 kg/m    Subjective:    Patient ID: Jaclyn West, female    DOB: 12/21/1970, 53 y.o.   MRN: [id number]  HPI: Jaclyn West is a 53 y.o. female  Chief Complaint  Patient presents with   Weight Check   WEIGHT GAIN Presents for weight follow-up.  Started Wegovy  07/04/23.  Weight at time was 205 lbs.  Has lost 44 lbs total, 7 lbs since last visit.  Is paying out of pocket for this, [facility].  No ADR with this.  Is running and exercising per her usual routine and focusing heavily on diet.  She does endorse reduced appetite.  Has two vials left.   Duration: years Previous attempts at weight loss: yes Complications of obesity: none Peak weight: 205 lbs Weight loss goal: 150 lbs Weight loss to date: 44 lbs Requesting obesity pharmacotherapy: yes Current weight loss supplements/medications: yes Previous weight loss supplements/meds: no Calories:  2000  HIP PAIN (LEFT) Started 3 weeks ago, did not run at all last week due to illness and rested.  Was doing running speed work when it happened.  Hurt during the session.  Has been to chiropractor for dry needling. Has helped some. Ran this morning and hurt mildly, 3-4/10.  Doesn't hurt sitting.   Duration: weeks Involved hip: left  Mechanism of injury: unknown Location: lateral Onset: sudden  Severity: 5/10 at worst getting out of car Quality: sharp, dull, aching, and throbbing Frequency: intermittent Radiation: no Aggravating factors: when getting out of car   Alleviating factors: movement and rest  Status: stable Treatments attempted: Ibuprofen, Tumeric, chiropractor, stretching Relief with NSAIDs?: mild Weakness with weight bearing: no Weakness with walking: no Paresthesias / decreased sensation: no Swelling: no Redness:no Fevers: no   Relevant past medical, surgical, family and social history  reviewed and updated as indicated. Interim medical history since our last visit reviewed. Allergies and medications reviewed and updated.  Review of Systems  Constitutional:  Negative for activity change, appetite change, diaphoresis, fatigue and fever.  Respiratory:  Negative for cough, chest tightness and shortness of breath.   Cardiovascular:  Negative for chest pain, palpitations and leg swelling.  Gastrointestinal: Negative.   Neurological:  Negative for dizziness, syncope, weakness, light-headedness, numbness and headaches.  Psychiatric/Behavioral: Negative.     Per HPI unless specifically indicated above     Objective:    BP 94/63   Pulse 75   Temp 97.9 F (36.6 C) (Oral)   Ht 5' 6.1" (1.679 m)   Wt 161 lb (73 kg)   SpO2 97%   BMI 25.91 kg/m   Wt Readings from Last 3 Encounters:  12/08/23 161 lb (73 kg)  10/21/23 168 lb 3.2 oz (76.3 kg)  08/12/23 184 lb 3.2 oz (83.6 kg)    Physical Exam Vitals and nursing note reviewed.  Constitutional:      General: She is awake. She is not in acute distress.    Appearance: She is well-developed and well-groomed. She is not ill-appearing or toxic-appearing.  HENT:     Head: Normocephalic.     Right Ear: Hearing and external ear normal.     Left Ear: Hearing and external ear normal.  Eyes:     General: Lids are normal.  Right eye: No discharge.        Left eye: No discharge.     Conjunctiva/sclera: Conjunctivae normal.     Pupils: Pupils are equal, round, and reactive to light.  Neck:     Thyroid: No thyromegaly.     Vascular: No carotid bruit.  Cardiovascular:     Rate and Rhythm: Normal rate and regular rhythm.     Heart sounds: Normal heart sounds. No murmur heard.    No gallop.  Pulmonary:     Effort: Pulmonary effort is normal. No accessory muscle usage or respiratory distress.     Breath sounds: Normal breath sounds.  Abdominal:     General: Bowel sounds are normal. There is no distension.     Palpations:  Abdomen is soft.     Tenderness: There is no abdominal tenderness.  Musculoskeletal:     Cervical back: Normal range of motion and neck supple.     Right hip: Normal.     Left hip: Tenderness (mild to lateral) present. No bony tenderness or crepitus. Normal range of motion. Normal strength.     Right lower leg: No edema.     Left lower leg: No edema.  Lymphadenopathy:     Cervical: No cervical adenopathy.  Skin:    General: Skin is warm and dry.  Neurological:     Mental Status: She is alert and oriented to person, place, and time.     Deep Tendon Reflexes: Reflexes are normal and symmetric.     Reflex Scores:      Brachioradialis reflexes are 2+ on the right side and 2+ on the left side.      Patellar reflexes are 2+ on the right side and 2+ on the left side. Psychiatric:        Attention and Perception: Attention normal.        Mood and Affect: Mood normal.        Speech: Speech normal.        Behavior: Behavior normal. Behavior is cooperative.        Thought Content: Thought content normal.    Results for orders placed or performed in visit on 05/12/23  CBC with Differential/Platelet   Collection Time: 05/12/23  4:19 PM  Result Value Ref Range   WBC 10.7 3.4 - 10.8 x10E3/uL   RBC 4.39 3.77 - 5.28 x10E6/uL   Hemoglobin 13.8 11.1 - 15.9 g/dL   Hematocrit 16.1 09.6 - 46.6 %   MCV 98 (H) 79 - 97 fL   MCH 31.4 26.6 - 33.0 pg   MCHC 32.2 31.5 - 35.7 g/dL   RDW 04.5 40.9 - 81.1 %   Platelets 295 150 - 450 x10E3/uL   Neutrophils 58 Not Estab. %   Lymphs 33 Not Estab. %   Monocytes 7 Not Estab. %   Eos 1 Not Estab. %   Basos 1 Not Estab. %   Neutrophils Absolute 6.2 1.4 - 7.0 x10E3/uL   Lymphocytes Absolute 3.6 (H) 0.7 - 3.1 x10E3/uL   Monocytes Absolute 0.8 0.1 - 0.9 x10E3/uL   EOS (ABSOLUTE) 0.1 0.0 - 0.4 x10E3/uL   Basophils Absolute 0.1 0.0 - 0.2 x10E3/uL   Immature Granulocytes 0 Not Estab. %   Immature Grans (Abs) 0.0 0.0 - 0.1 x10E3/uL  Comprehensive metabolic  panel   Collection Time: 05/12/23  4:19 PM  Result Value Ref Range   Glucose 87 70 - 99 mg/dL   BUN 21 6 - 24 mg/dL   Creatinine,  Ser 0.87 0.57 - 1.00 mg/dL   eGFR 80 >78 IO/NGE/9.52   BUN/Creatinine Ratio 24 (H) 9 - 23   Sodium 136 134 - 144 mmol/L   Potassium 4.5 3.5 - 5.2 mmol/L   Chloride 99 96 - 106 mmol/L   CO2 22 20 - 29 mmol/L   Calcium 9.8 8.7 - 10.2 mg/dL   Total Protein 7.0 6.0 - 8.5 g/dL   Albumin 4.4 3.8 - 4.9 g/dL   Globulin, Total 2.6 1.5 - 4.5 g/dL   Bilirubin Total 0.3 0.0 - 1.2 mg/dL   Alkaline Phosphatase 66 44 - 121 IU/L   AST 35 0 - 40 IU/L   ALT 40 (H) 0 - 32 IU/L  TSH   Collection Time: 05/12/23  4:19 PM  Result Value Ref Range   TSH 2.160 0.450 - 4.500 uIU/mL  Lipid Panel w/o Chol/HDL Ratio   Collection Time: 05/12/23  4:19 PM  Result Value Ref Range   Cholesterol, Total 251 (H) 100 - 199 mg/dL   Triglycerides 841 0 - 149 mg/dL   HDL 79 >32 mg/dL   VLDL Cholesterol Cal 20 5 - 40 mg/dL   LDL Chol Calc (NIH) 440 (H) 0 - 99 mg/dL      Assessment & Plan:   Problem List Items Addressed This Visit       Other   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  Noted on past labs with ASCVD <7% -- recheck lipid panel at physical and continue diet /exercise focus. The 10-year ASCVD risk score (Arnett DK, et al., 2019) is: 0.7%   Values used to calculate the score:     Age: 12 years     Sex: Female     Is Non-Hispanic African American: No     Diabetic: No     Tobacco smoker: No     Systolic Blood Pressure: 94 mmHg     Is BP treated: No     HDL Cholesterol: 79 mg/dL     Total Cholesterol: 251 mg/dL       BMI 10.2-72.5,DGUYQ   On Wegovy  and tolerating well with 44 lbs lost thus far.  Will continue current dosing and adjust as needed, send refills when requested.  No family history of thyroid cancer (MTC, MEN 2, thyroid cell tumors) or pancreatitis.  Educated her on medication and side effects.  Recommended  eating smaller high protein, low fat meals more frequently and exercising 30 mins a day 5 times a week with a goal of 10-15lb weight loss in the next 3 months. Patient voiced their understanding and motivation to adhere to these recommendations. She is very active at baseline and focuses on diet, which are beneficial.          Acute hip pain, left   Acute for a few weeks, overall exam reassuring.  Suspect tendon inflammation, low suspicion for tear.  Recommend no running speed work until 100% improved.  Use Voltaren gel and ensure stretching prior and after running + stretching daily.  To return to office if any worsening or ongoing.          Follow up plan: Return in about 3 months (around 03/09/2024) for WEIGHT CHECK.

## 2023-12-08 NOTE — Assessment & Plan Note (Signed)
 Ongoing.  Noted on past labs with ASCVD <7% -- recheck lipid panel at physical and continue diet /exercise focus. The 10-year ASCVD risk score (Arnett DK, et al., 2019) is: 0.7%   Values used to calculate the score:     Age: 53 years     Sex: Female     Is Non-Hispanic African American: No     Diabetic: No     Tobacco smoker: No     Systolic Blood Pressure: 94 mmHg     Is BP treated: No     HDL Cholesterol: 79 mg/dL     Total Cholesterol: 251 mg/dL

## 2023-12-08 NOTE — Assessment & Plan Note (Signed)
 On Wegovy  and tolerating well with 44 lbs lost thus far.  Will continue current dosing and adjust as needed, send refills when requested.  No family history of thyroid cancer (MTC, MEN 2, thyroid cell tumors) or pancreatitis.  Educated her on medication and side effects.  Recommended eating smaller high protein, low fat meals more frequently and exercising 30 mins a day 5 times a week with a goal of 10-15lb weight loss in the next 3 months. Patient voiced their understanding and motivation to adhere to these recommendations. She is very active at baseline and focuses on diet, which are beneficial.

## 2023-12-08 NOTE — Assessment & Plan Note (Signed)
 Acute for a few weeks, overall exam reassuring.  Suspect tendon inflammation, low suspicion for tear.  Recommend no running speed work until 100% improved.  Use Voltaren gel and ensure stretching prior and after running + stretching daily.  To return to office if any worsening or ongoing.

## 2024-03-06 NOTE — Patient Instructions (Signed)
 Be Involved in Caring For Your Health:  Taking Medications When medications are taken as directed, they can greatly improve your health. But if they are not taken as prescribed, they may not work. In some cases, not taking them correctly can be harmful. To help ensure your treatment remains effective and safe, understand your medications and how to take them. Bring your medications to each visit for review by your provider.  Your lab results, notes, and after visit summary will be available on My Chart. We strongly encourage you to use this feature. If lab results are abnormal the clinic will contact you with the appropriate steps. If the clinic does not contact you assume the results are satisfactory. You can always view your results on My Chart. If you have questions regarding your health or results, please contact the clinic during office hours. You can also ask questions on My Chart.  We at [facility] are grateful that you chose us  to provide your care. We strive to provide evidence-based and compassionate care and are always looking for feedback. If you get a survey from the clinic please complete this so we can hear your opinions.  Healthy Eating, Adult Healthy eating may help you get and keep a healthy body weight, reduce the risk of chronic disease, and live a long and productive life. It is important to follow a healthy eating pattern. Your nutritional and calorie needs should be met mainly by different nutrient-rich foods. What are tips for following this plan? Reading food labels Read labels and choose the following: Reduced or low sodium products. Juices with 100% fruit juice. Foods with low saturated fats (<3 g per serving) and high polyunsaturated and monounsaturated fats. Foods with whole grains, such as whole wheat, cracked wheat, brown rice, and wild rice. Whole grains that are fortified with folic acid. This is recommended for females who are pregnant or who want to  become pregnant. Read labels and do not eat or drink the following: Foods or drinks with added sugars. These include foods that contain brown sugar, corn sweetener, corn syrup, dextrose , fructose, glucose, high-fructose corn syrup, honey, invert sugar, lactose, malt syrup, maltose, molasses, raw sugar, sucrose, trehalose, or turbinado sugar. Limit your intake of added sugars to less than 10% of your total daily calories. Do not eat more than the following amounts of added sugar per day: 6 teaspoons (25 g) for females. 9 teaspoons (38 g) for males. Foods that contain processed or refined starches and grains. Refined grain products, such as white flour, degermed cornmeal, white bread, and white rice. Shopping Choose nutrient-rich snacks, such as vegetables, whole fruits, and nuts. Avoid high-calorie and high-sugar snacks, such as potato chips, fruit snacks, and candy. Use oil-based dressings and spreads on foods instead of solid fats such as butter, margarine, sour cream, or cream cheese. Limit pre-made sauces, mixes, and instant products such as flavored rice, instant noodles, and ready-made pasta. Try more plant-protein sources, such as tofu, tempeh, black beans, edamame, lentils, nuts, and seeds. Explore eating plans such as the Mediterranean diet or vegetarian diet. Try heart-healthy dips made with beans and healthy fats like hummus and guacamole. Vegetables go great with these. Cooking Use oil to saut or stir-fry foods instead of solid fats such as butter, margarine, or lard. Try baking, boiling, grilling, or broiling instead of frying. Remove the fatty part of meats before cooking. Steam vegetables in water  or broth. Meal planning  At meals, imagine dividing your plate into fourths: One-half of  your plate is fruits and vegetables. One-fourth of your plate is whole grains. One-fourth of your plate is protein, especially lean meats, poultry, eggs, tofu, beans, or nuts. Include low-fat  dairy as part of your daily diet. Lifestyle Choose healthy options in all settings, including home, work, school, restaurants, or stores. Prepare your food safely: Wash your hands after handling raw meats. Where you prepare food, keep surfaces clean by regularly washing with hot, soapy water . Keep raw meats separate from ready-to-eat foods, such as fruits and vegetables. Cook seafood, meat, poultry, and eggs to the recommended temperature. Get a food thermometer. Store foods at safe temperatures. In general: Keep cold foods at 84F (4.4C) or below. Keep hot foods at 184F (60C) or above. Keep your freezer at [facility] (-17.8C) or below. Foods are not safe to eat if they have been between the temperatures of 40-184F (4.4-60C) for more than 2 hours. What foods should I eat? Fruits Aim to eat 1-2 cups of fresh, canned (in natural juice), or frozen fruits each day. One cup of fruit equals 1 small apple, 1 large banana, 8 large strawberries, 1 cup (237 g) canned fruit,  cup (82 g) dried fruit, or 1 cup (240 mL) 100% juice. Vegetables Aim to eat 2-4 cups of fresh and frozen vegetables each day, including different varieties and colors. One cup of vegetables equals 1 cup (91 g) broccoli or cauliflower florets, 2 medium carrots, 2 cups (150 g) raw, leafy greens, 1 large tomato, 1 large bell pepper, 1 large sweet potato, or 1 medium white potato. Grains Aim to eat 5-10 ounce-equivalents of whole grains each day. Examples of 1 ounce-equivalent of grains include 1 slice of bread, 1 cup (40 g) ready-to-eat cereal, 3 cups (24 g) popcorn, or  cup (93 g) cooked rice. Meats and other proteins Try to eat 5-7 ounce-equivalents of protein each day. Examples of 1 ounce-equivalent of protein include 1 egg,  oz nuts (12 almonds, 24 pistachios, or 7 walnut halves), 1/4 cup (90 g) cooked beans, 6 tablespoons (90 g) hummus or 1 tablespoon (16 g) peanut butter. A cut of meat or fish that is the size of a deck of  cards is about 3-4 ounce-equivalents (85 g). Of the protein you eat each week, try to have at least 8 sounce (227 g) of seafood. This is about 2 servings per week. This includes salmon, trout, herring, sardines, and anchovies. Dairy Aim to eat 3 cup-equivalents of fat-free or low-fat dairy each day. Examples of 1 cup-equivalent of dairy include 1 cup (240 mL) milk, 8 ounces (250 g) yogurt, 1 ounces (44 g) natural cheese, or 1 cup (240 mL) fortified soy milk. Fats and oils Aim for about 5 teaspoons (21 g) of fats and oils per day. Choose monounsaturated fats, such as canola and olive oils, mayonnaise made with olive oil or avocado oil, avocados, peanut butter, and most nuts, or polyunsaturated fats, such as sunflower, corn, and soybean oils, walnuts, pine nuts, sesame seeds, sunflower seeds, and flaxseed. Beverages Aim for 6 eight-ounce glasses of water  per day. Limit coffee to 3-5 eight-ounce cups per day. Limit caffeinated beverages that have added calories, such as soda and energy drinks. If you drink alcohol: Limit how much you have to: 0-1 drink a day if you are female. 0-2 drinks a day if you are female. Know how much alcohol is in your drink. In the [place] one drink is one 12 oz bottle of beer (355 mL), one 5 oz glass of wine (  148 mL), or one 1 oz glass of hard liquor (44 mL). Seasoning and other foods Try not to add too much salt to your food. Try using herbs and spices instead of salt. Try not to add sugar to food. This information is based on [place] nutrition guidelines. To learn more, visit [employer]. Exact amounts may vary. You may need different amounts. This information is not intended to replace advice given to you by your health care provider. Make sure you discuss any questions you have with your health care provider. Document Revised: 03/17/2022 Document Reviewed: 03/17/2022 Elsevier Patient Education  2024 [employer].

## 2024-03-09 ENCOUNTER — Ambulatory Visit: Admitting: Nurse Practitioner

## 2024-03-09 ENCOUNTER — Encounter: Payer: Self-pay | Admitting: Nurse Practitioner

## 2024-03-09 VITALS — BP 118/76 | HR 58 | Temp 98.0°F | Ht 66.0 in | Wt 152.2 lb

## 2024-03-09 DIAGNOSIS — Z6832 Body mass index (BMI) 32.0-32.9, adult: Secondary | ICD-10-CM | POA: Diagnosis not present

## 2024-03-09 DIAGNOSIS — Z713 Dietary counseling and surveillance: Secondary | ICD-10-CM | POA: Diagnosis not present

## 2024-03-09 MED ORDER — WEGOVY 1 MG/0.5ML ~~LOC~~ SOAJ: 1.0000 mg | SUBCUTANEOUS | 3 refills | Status: DC

## 2024-03-09 NOTE — Progress Notes (Signed)
 BP 118/76 (BP Location: Left Arm, Patient Position: Sitting)   Pulse (!) 58   Temp 98 F (36.7 C)   Ht 5' 6 (1.676 m)   Wt 152 lb 3.2 oz (69 kg)   SpO2 99%   BMI 24.57 kg/m    Subjective:    Patient ID: Jaclyn West, female    DOB: July 27, 1970, 53 y.o.   MRN: [id number]  HPI: Jaclyn West is a 53 y.o. female  Chief Complaint  Patient presents with   Weight Check    3 mon f/u    WEIGHT GAIN Presents for weight follow-up.  Started Wegovy  07/04/23.  Weight at time was 205 lbs. Has lost 53 lbs total, 9 lbs since last visit. Pays out of pocket for this, [facility].  No ADR with this. Continues to run and exercise daily + did eat well during the summer. Duration: years Previous attempts at weight loss: yes Complications of obesity: none Peak weight: 205 lbs Weight loss goal: 150 lbs Weight loss to date: 53 lbs Requesting obesity pharmacotherapy: yes Current weight loss supplements/medications: yes Previous weight loss supplements/meds: no Calories:  2000  Relevant past medical, surgical, family and social history reviewed and updated as indicated. Interim medical history since our last visit reviewed. Allergies and medications reviewed and updated.  Review of Systems  Constitutional:  Negative for activity change, appetite change, diaphoresis, fatigue and fever.  Respiratory:  Negative for cough, chest tightness and shortness of breath.   Cardiovascular:  Negative for chest pain, palpitations and leg swelling.  Gastrointestinal: Negative.   Neurological:  Negative for dizziness, syncope, weakness, light-headedness, numbness and headaches.  Psychiatric/Behavioral: Negative.     Per HPI unless specifically indicated above     Objective:    BP 118/76 (BP Location: Left Arm, Patient Position: Sitting)   Pulse (!) 58   Temp 98 F (36.7 C)   Ht 5' 6 (1.676 m)   Wt 152 lb 3.2 oz (69 kg)   SpO2 99%   BMI 24.57 kg/m   Wt Readings from Last 3 Encounters:  03/09/24  152 lb 3.2 oz (69 kg)  12/08/23 161 lb (73 kg)  10/21/23 168 lb 3.2 oz (76.3 kg)    Physical Exam Vitals and nursing note reviewed.  Constitutional:      General: She is awake. She is not in acute distress.    Appearance: She is well-developed and well-groomed. She is not ill-appearing or toxic-appearing.  HENT:     Head: Normocephalic.     Right Ear: Hearing and external ear normal.     Left Ear: Hearing and external ear normal.  Eyes:     General: Lids are normal.        Right eye: No discharge.        Left eye: No discharge.     Conjunctiva/sclera: Conjunctivae normal.     Pupils: Pupils are equal, round, and reactive to light.  Neck:     Thyroid: No thyromegaly.     Vascular: No carotid bruit.  Cardiovascular:     Rate and Rhythm: Normal rate and regular rhythm.     Heart sounds: Normal heart sounds. No murmur heard.    No gallop.  Pulmonary:     Effort: Pulmonary effort is normal. No accessory muscle usage or respiratory distress.     Breath sounds: Normal breath sounds.  Abdominal:     General: Bowel sounds are normal. There is no distension.     Palpations:  Abdomen is soft.     Tenderness: There is no abdominal tenderness.  Musculoskeletal:     Cervical back: Normal range of motion and neck supple.     Right lower leg: No edema.     Left lower leg: No edema.  Lymphadenopathy:     Cervical: No cervical adenopathy.  Skin:    General: Skin is warm and dry.  Neurological:     Mental Status: She is alert and oriented to person, place, and time.     Deep Tendon Reflexes: Reflexes are normal and symmetric.     Reflex Scores:      Brachioradialis reflexes are 2+ on the right side and 2+ on the left side.      Patellar reflexes are 2+ on the right side and 2+ on the left side. Psychiatric:        Attention and Perception: Attention normal.        Mood and Affect: Mood normal.        Speech: Speech normal.        Behavior: Behavior normal. Behavior is cooperative.         Thought Content: Thought content normal.    Results for orders placed or performed in visit on 05/12/23  CBC with Differential/Platelet   Collection Time: 05/12/23  4:19 PM  Result Value Ref Range   WBC 10.7 3.4 - 10.8 x10E3/uL   RBC 4.39 3.77 - 5.28 x10E6/uL   Hemoglobin 13.8 11.1 - 15.9 g/dL   Hematocrit 57.1 65.9 - 46.6 %   MCV 98 (H) 79 - 97 fL   MCH 31.4 26.6 - 33.0 pg   MCHC 32.2 31.5 - 35.7 g/dL   RDW 88.2 88.2 - 84.5 %   Platelets 295 150 - 450 x10E3/uL   Neutrophils 58 Not Estab. %   Lymphs 33 Not Estab. %   Monocytes 7 Not Estab. %   Eos 1 Not Estab. %   Basos 1 Not Estab. %   Neutrophils Absolute 6.2 1.4 - 7.0 x10E3/uL   Lymphocytes Absolute 3.6 (H) 0.7 - 3.1 x10E3/uL   Monocytes Absolute 0.8 0.1 - 0.9 x10E3/uL   EOS (ABSOLUTE) 0.1 0.0 - 0.4 x10E3/uL   Basophils Absolute 0.1 0.0 - 0.2 x10E3/uL   Immature Granulocytes 0 Not Estab. %   Immature Grans (Abs) 0.0 0.0 - 0.1 x10E3/uL  Comprehensive metabolic panel   Collection Time: 05/12/23  4:19 PM  Result Value Ref Range   Glucose 87 70 - 99 mg/dL   BUN 21 6 - 24 mg/dL   Creatinine, Ser 9.12 0.57 - 1.00 mg/dL   eGFR 80 >40 fO/fpw/8.26   BUN/Creatinine Ratio 24 (H) 9 - 23   Sodium 136 134 - 144 mmol/L   Potassium 4.5 3.5 - 5.2 mmol/L   Chloride 99 96 - 106 mmol/L   CO2 22 20 - 29 mmol/L   Calcium 9.8 8.7 - 10.2 mg/dL   Total Protein 7.0 6.0 - 8.5 g/dL   Albumin 4.4 3.8 - 4.9 g/dL   Globulin, Total 2.6 1.5 - 4.5 g/dL   Bilirubin Total 0.3 0.0 - 1.2 mg/dL   Alkaline Phosphatase 66 44 - 121 IU/L   AST 35 0 - 40 IU/L   ALT 40 (H) 0 - 32 IU/L  TSH   Collection Time: 05/12/23  4:19 PM  Result Value Ref Range   TSH 2.160 0.450 - 4.500 uIU/mL  Lipid Panel w/o Chol/HDL Ratio   Collection Time: 05/12/23  4:19 PM  Result Value Ref Range   Cholesterol, Total 251 (H) 100 - 199 mg/dL   Triglycerides 885 0 - 149 mg/dL   HDL 79 >60 mg/dL   VLDL Cholesterol Cal 20 5 - 40 mg/dL   LDL Chol Calc (NIH) 847 (H) 0 - 99  mg/dL      Assessment & Plan:   Problem List Items Addressed This Visit       Other   BMI 32.0-32.9,adult - Primary   On Wegovy  and tolerating well with 53 lbs lost thus far and BMI trended down + weight almost at her goal.  Will continue current dosing and adjust as needed, send refills when requested.  No family history of thyroid cancer (MTC, MEN 2, thyroid cell tumors) or pancreatitis.  Educated her on medication and side effects.  Recommended eating smaller high protein, low fat meals more frequently and exercising 30 mins a day 5 times a week with a goal of 10-15lb weight loss in the next 3 months. Patient voiced their understanding and motivation to adhere to these recommendations. She is very active at baseline and focuses on diet, which are beneficial.          Follow up plan: Return in about 9 weeks (around 05/11/2024) for Annual Physical.

## 2024-03-09 NOTE — Assessment & Plan Note (Signed)
 On Wegovy  and tolerating well with 53 lbs lost thus far and BMI trended down + weight almost at her goal.  Will continue current dosing and adjust as needed, send refills when requested.  No family history of thyroid cancer (MTC, MEN 2, thyroid cell tumors) or pancreatitis.  Educated her on medication and side effects.  Recommended eating smaller high protein, low fat meals more frequently and exercising 30 mins a day 5 times a week with a goal of 10-15lb weight loss in the next 3 months. Patient voiced their understanding and motivation to adhere to these recommendations. She is very active at baseline and focuses on diet, which are beneficial.

## 2024-04-18 ENCOUNTER — Ambulatory Visit: Admitting: Family Medicine

## 2024-05-15 NOTE — Patient Instructions (Signed)
 Be Involved in Caring For Your Health:  Taking Medications When medications are taken as directed, they can greatly improve your health. But if they are not taken as prescribed, they may not work. In some cases, not taking them correctly can be harmful. To help ensure your treatment remains effective and safe, understand your medications and how to take them. Bring your medications to each visit for review by your provider.  Your lab results, notes, and after visit summary will be available on My Chart. We strongly encourage you to use this feature. If lab results are abnormal the clinic will contact you with the appropriate steps. If the clinic does not contact you assume the results are satisfactory. You can always view your results on My Chart. If you have questions regarding your health or results, please contact the clinic during office hours. You can also ask questions on My Chart.  We at [facility] are grateful that you chose us  to provide your care. We strive to provide evidence-based and compassionate care and are always looking for feedback. If you get a survey from the clinic please complete this so we can hear your opinions.  Healthy Eating, Adult Healthy eating may help you get and keep a healthy body weight, reduce the risk of chronic disease, and live a long and productive life. It is important to follow a healthy eating pattern. Your nutritional and calorie needs should be met mainly by different nutrient-rich foods. What are tips for following this plan? Reading food labels Read labels and choose the following: Reduced or low sodium products. Juices with 100% fruit juice. Foods with low saturated fats (<3 g per serving) and high polyunsaturated and monounsaturated fats. Foods with whole grains, such as whole wheat, cracked wheat, brown rice, and wild rice. Whole grains that are fortified with folic acid. This is recommended for females who are pregnant or who want to  become pregnant. Read labels and do not eat or drink the following: Foods or drinks with added sugars. These include foods that contain brown sugar, corn sweetener, corn syrup, dextrose , fructose, glucose, high-fructose corn syrup, honey, invert sugar, lactose, malt syrup, maltose, molasses, raw sugar, sucrose, trehalose, or turbinado sugar. Limit your intake of added sugars to less than 10% of your total daily calories. Do not eat more than the following amounts of added sugar per day: 6 teaspoons (25 g) for females. 9 teaspoons (38 g) for males. Foods that contain processed or refined starches and grains. Refined grain products, such as white flour, degermed cornmeal, white bread, and white rice. Shopping Choose nutrient-rich snacks, such as vegetables, whole fruits, and nuts. Avoid high-calorie and high-sugar snacks, such as potato chips, fruit snacks, and candy. Use oil-based dressings and spreads on foods instead of solid fats such as butter, margarine, sour cream, or cream cheese. Limit pre-made sauces, mixes, and instant products such as flavored rice, instant noodles, and ready-made pasta. Try more plant-protein sources, such as tofu, tempeh, black beans, edamame, lentils, nuts, and seeds. Explore eating plans such as the Mediterranean diet or vegetarian diet. Try heart-healthy dips made with beans and healthy fats like hummus and guacamole. Vegetables go great with these. Cooking Use oil to saut or stir-fry foods instead of solid fats such as butter, margarine, or lard. Try baking, boiling, grilling, or broiling instead of frying. Remove the fatty part of meats before cooking. Steam vegetables in water  or broth. Meal planning  At meals, imagine dividing your plate into fourths: One-half of  your plate is fruits and vegetables. One-fourth of your plate is whole grains. One-fourth of your plate is protein, especially lean meats, poultry, eggs, tofu, beans, or nuts. Include low-fat  dairy as part of your daily diet. Lifestyle Choose healthy options in all settings, including home, work, school, restaurants, or stores. Prepare your food safely: Wash your hands after handling raw meats. Where you prepare food, keep surfaces clean by regularly washing with hot, soapy water . Keep raw meats separate from ready-to-eat foods, such as fruits and vegetables. Cook seafood, meat, poultry, and eggs to the recommended temperature. Get a food thermometer. Store foods at safe temperatures. In general: Keep cold foods at 84F (4.4C) or below. Keep hot foods at 184F (60C) or above. Keep your freezer at [facility] (-17.8C) or below. Foods are not safe to eat if they have been between the temperatures of 40-184F (4.4-60C) for more than 2 hours. What foods should I eat? Fruits Aim to eat 1-2 cups of fresh, canned (in natural juice), or frozen fruits each day. One cup of fruit equals 1 small apple, 1 large banana, 8 large strawberries, 1 cup (237 g) canned fruit,  cup (82 g) dried fruit, or 1 cup (240 mL) 100% juice. Vegetables Aim to eat 2-4 cups of fresh and frozen vegetables each day, including different varieties and colors. One cup of vegetables equals 1 cup (91 g) broccoli or cauliflower florets, 2 medium carrots, 2 cups (150 g) raw, leafy greens, 1 large tomato, 1 large bell pepper, 1 large sweet potato, or 1 medium white potato. Grains Aim to eat 5-10 ounce-equivalents of whole grains each day. Examples of 1 ounce-equivalent of grains include 1 slice of bread, 1 cup (40 g) ready-to-eat cereal, 3 cups (24 g) popcorn, or  cup (93 g) cooked rice. Meats and other proteins Try to eat 5-7 ounce-equivalents of protein each day. Examples of 1 ounce-equivalent of protein include 1 egg,  oz nuts (12 almonds, 24 pistachios, or 7 walnut halves), 1/4 cup (90 g) cooked beans, 6 tablespoons (90 g) hummus or 1 tablespoon (16 g) peanut butter. A cut of meat or fish that is the size of a deck of  cards is about 3-4 ounce-equivalents (85 g). Of the protein you eat each week, try to have at least 8 sounce (227 g) of seafood. This is about 2 servings per week. This includes salmon, trout, herring, sardines, and anchovies. Dairy Aim to eat 3 cup-equivalents of fat-free or low-fat dairy each day. Examples of 1 cup-equivalent of dairy include 1 cup (240 mL) milk, 8 ounces (250 g) yogurt, 1 ounces (44 g) natural cheese, or 1 cup (240 mL) fortified soy milk. Fats and oils Aim for about 5 teaspoons (21 g) of fats and oils per day. Choose monounsaturated fats, such as canola and olive oils, mayonnaise made with olive oil or avocado oil, avocados, peanut butter, and most nuts, or polyunsaturated fats, such as sunflower, corn, and soybean oils, walnuts, pine nuts, sesame seeds, sunflower seeds, and flaxseed. Beverages Aim for 6 eight-ounce glasses of water  per day. Limit coffee to 3-5 eight-ounce cups per day. Limit caffeinated beverages that have added calories, such as soda and energy drinks. If you drink alcohol: Limit how much you have to: 0-1 drink a day if you are female. 0-2 drinks a day if you are female. Know how much alcohol is in your drink. In the [place] one drink is one 12 oz bottle of beer (355 mL), one 5 oz glass of wine (  148 mL), or one 1 oz glass of hard liquor (44 mL). Seasoning and other foods Try not to add too much salt to your food. Try using herbs and spices instead of salt. Try not to add sugar to food. This information is based on [place] nutrition guidelines. To learn more, visit [employer]. Exact amounts may vary. You may need different amounts. This information is not intended to replace advice given to you by your health care provider. Make sure you discuss any questions you have with your health care provider. Document Revised: 03/17/2022 Document Reviewed: 03/17/2022 Elsevier Patient Education  2024 [employer].

## 2024-05-18 ENCOUNTER — Encounter: Payer: Self-pay | Admitting: Nurse Practitioner

## 2024-05-18 ENCOUNTER — Ambulatory Visit (INDEPENDENT_AMBULATORY_CARE_PROVIDER_SITE_OTHER): Admitting: Nurse Practitioner

## 2024-05-18 VITALS — BP 112/74 | HR 59 | Temp 98.4°F | Resp 17 | Ht 67.0 in | Wt 149.2 lb

## 2024-05-18 DIAGNOSIS — E7800 Pure hypercholesterolemia, unspecified: Secondary | ICD-10-CM | POA: Diagnosis not present

## 2024-05-18 DIAGNOSIS — Z6832 Body mass index (BMI) 32.0-32.9, adult: Secondary | ICD-10-CM

## 2024-05-18 DIAGNOSIS — Z0000 Encounter for general adult medical examination without abnormal findings: Secondary | ICD-10-CM | POA: Diagnosis not present

## 2024-05-18 DIAGNOSIS — K429 Umbilical hernia without obstruction or gangrene: Secondary | ICD-10-CM

## 2024-05-18 MED ORDER — WEGOVY 1 MG/0.5ML ~~LOC~~ SOAJ: 1.0000 mg | SUBCUTANEOUS | 3 refills | Status: AC

## 2024-05-18 NOTE — Progress Notes (Signed)
 BP 112/74 (BP Location: Left Arm, Patient Position: Sitting, Cuff Size: Normal)   Pulse (!) 59   Temp 98.4 F (36.9 C) (Oral)   Resp 17   Ht 5' 7 (1.702 m)   Wt 149 lb 3.2 oz (67.7 kg)   SpO2 98%   BMI 23.37 kg/m    Subjective:    Patient ID: Jaclyn West, female    DOB: 1970-12-16, 54 y.o.   MRN: [id number]  HPI: Jaclyn West is a 53 y.o. female presenting on 05/18/2024 for comprehensive medical examination. Current medical complaints include:none  She currently lives with: husband Menopausal Symptoms: no  -- continues to have menstrual cycles  WEIGHT GAIN Started Wegovy  07/04/23.  Weight at time was 205 lbs. Has lost 56 lbs total, 3 lbs since last visit. Pays out of pocket for this, [facility]. No ADR with this. Continues to run and exercise daily + did eat well during the summer. Duration: years Previous attempts at weight loss: yes Complications of obesity: none Peak weight: 205 lbs Weight loss goal: 150 lbs Weight loss to date: 56 lbs Requesting obesity pharmacotherapy: yes Current weight loss supplements/medications: yes Previous weight loss supplements/meds: no Calories:  2000  The 10-year ASCVD risk score (Arnett DK, et al., 2019) is: 1.1%   Values used to calculate the score:     Age: 39 years     Clincally relevant sex: Female     Is Non-Hispanic African American: No     Diabetic: No     Tobacco smoker: No     Systolic Blood Pressure: 112 mmHg     Is BP treated: No     HDL Cholesterol: 79 mg/dL     Total Cholesterol: 251 mg/dL      88/80/7974    6:59 PM 03/09/2024    4:01 PM 12/08/2023    9:01 AM 10/21/2023    4:10 PM 08/12/2023    4:11 PM  Depression screen PHQ 2/9  Decreased Interest 0 0 0 0 0  Down, Depressed, Hopeless 0 0 0 0 0  PHQ - 2 Score 0 0 0 0 0  Altered sleeping 0 0 0 0 0  Tired, decreased energy 0 0 0 0 0  Change in appetite 0 0 0 0 0  Feeling bad or failure about yourself  0 0 0 0 0  Trouble concentrating 0 0 0 0 0  Moving  slowly or fidgety/restless 0 0 0 0 0  Suicidal thoughts 0 0 0 0 0  PHQ-9 Score 0 0  0  0  0   Difficult doing work/chores  Not difficult at all Not difficult at all Not difficult at all Not difficult at all     Data saved with a previous flowsheet row definition      05/18/2024    3:40 PM 03/09/2024    4:01 PM 12/08/2023    9:02 AM 10/21/2023    4:10 PM  GAD 7 : Generalized Anxiety Score  Nervous, Anxious, on Edge 0 0 0 0  Control/stop worrying 0 0 0 0  Worry too much - different things 0 0 0 0  Trouble relaxing 0 0 0 0  Restless 0 0 0 0  Easily annoyed or irritable 0 0 0 0  Afraid - awful might happen 0 0 0 0  Total GAD 7 Score 0 0 0 0  Anxiety Difficulty  Not difficult at all Not difficult at all Not difficult at all  08/12/2023    4:11 PM 10/21/2023    4:10 PM 12/08/2023    9:01 AM 03/09/2024    4:01 PM 05/18/2024    3:40 PM  Fall Risk  Falls in the past year? 0 0 0 0 0  Was there an injury with Fall? 0 0 0  0  Fall Risk Category Calculator 0 0 0  0  Patient at Risk for Falls Due to No Fall Risks No Fall Risks No Fall Risks  No Fall Risks  Fall risk Follow up Falls evaluation completed Falls evaluation completed Falls evaluation completed  Falls evaluation completed    Functional Status Survey: Is the patient deaf or have difficulty hearing?: No Does the patient have difficulty seeing, even when wearing glasses/contacts?: No Does the patient have difficulty concentrating, remembering, or making decisions?: No Does the patient have difficulty walking or climbing stairs?: No Does the patient have difficulty dressing or bathing?: No Does the patient have difficulty doing errands alone such as visiting a doctor's office or shopping?: No    Past Medical History:  Past Medical History:  Diagnosis Date   Acne    Depression    Double cervix    Motion sickness    car - back seat   Obesity    Premenopause menorrhagia    Uterus didelphys    Wears contact lenses     Surgical History:  Past Surgical History:  Procedure Laterality Date   CESAREAN SECTION     x 2   COLONOSCOPY WITH PROPOFOL  N/A 07/17/2022   Procedure: COLONOSCOPY WITH PROPOFOL ;  Surgeon: Unk Corinn Skiff, MD;  Location: [facility];  Service: Endoscopy;  Laterality: N/A;   POLYPECTOMY  07/17/2022   Procedure: POLYPECTOMY;  Surgeon: Unk Corinn Skiff, MD;  Location: [facility];  Service: Endoscopy;;   SKIN LESION EXCISION      Medications:  Current Outpatient Medications on File Prior to Visit  Medication Sig   Melatonin 10 MG TABS Take 10 mg by mouth daily at 2 PM.   No current facility-administered medications on file prior to visit.    Allergies:  Allergies  Allergen Reactions   Iodinated Contrast Media Itching   Red Dye #40 (Allura Red) Itching   Shellfish Allergy Itching and Rash    Lobster    Social History:  Social History   Socioeconomic History   Marital status: Married    Spouse name: Not on file   Number of children: Not on file   Years of education: Not on file   Highest education level: Not on file  Occupational History   Not on file  Tobacco Use   Smoking status: Never   Smokeless tobacco: Never  Vaping Use   Vaping status: Never Used  Substance and Sexual Activity   Alcohol use: Not Currently   Drug use: Never   Sexual activity: Yes  Other Topics Concern   Not on file  Social History Narrative   Not on file   Social Drivers of Health   Financial Resource Strain: Low Risk  (05/12/2023)   Overall Financial Resource Strain (CARDIA)    Difficulty of Paying Living Expenses: Not hard at all  Food Insecurity: No Food Insecurity (05/12/2023)   Hunger Vital Sign    Worried About Running Out of Food in the Last Year: Never true    Ran Out of Food in the Last Year: Never true  Transportation Needs: No Transportation Needs (05/12/2023)   PRAPARE - Transportation  Lack of Transportation (Medical): No    Lack of  Transportation (Non-Medical): No  Physical Activity: Sufficiently Active (05/12/2023)   Exercise Vital Sign    Days of Exercise per Week: 5 days    Minutes of Exercise per Session: 50 min  Stress: No Stress Concern Present (05/12/2023)   [employer] of Occupational Health - Occupational Stress Questionnaire    Feeling of Stress : Not at all  Social Connections: Moderately Integrated (05/12/2023)   Social Connection and Isolation Panel    Frequency of Communication with Friends and Family: More than three times a week    Frequency of Social Gatherings with Friends and Family: More than three times a week    Attends Religious Services: Never    Database Administrator or Organizations: Yes    Attends Engineer, Structural: More than 4 times per year    Marital Status: Married  Catering Manager Violence: Not At Risk (05/12/2023)   Humiliation, Afraid, Rape, and Kick questionnaire    Fear of Current or Ex-Partner: No    Emotionally Abused: No    Physically Abused: No    Sexually Abused: No   Social History   Tobacco Use  Smoking Status Never  Smokeless Tobacco Never   Social History   Substance and Sexual Activity  Alcohol Use Not Currently    Family History:  Family History  Problem Relation Age of Onset   Hypothyroidism Mother    Hypertension Mother    Arthritis Mother    Kidney cancer Mother    Heart disease Father        MI   Heart disease Brother    Brain cancer Brother    Heart disease Brother    Anemia Daughter    Asthma Son    Heart disease Maternal Grandmother        CHF   Stroke Maternal Grandfather    Heart disease Paternal Grandfather     Past medical history, surgical history, medications, allergies, family history and social history reviewed with patient today and changes made to appropriate areas of the chart.   ROS All other ROS negative except what is listed above and in the HPI.      Objective:    BP 112/74 (BP Location: Left  Arm, Patient Position: Sitting, Cuff Size: Normal)   Pulse (!) 59   Temp 98.4 F (36.9 C) (Oral)   Resp 17   Ht 5' 7 (1.702 m)   Wt 149 lb 3.2 oz (67.7 kg)   SpO2 98%   BMI 23.37 kg/m   Wt Readings from Last 3 Encounters:  05/18/24 149 lb 3.2 oz (67.7 kg)  03/09/24 152 lb 3.2 oz (69 kg)  12/08/23 161 lb (73 kg)    Physical Exam Vitals and nursing note reviewed. Exam conducted with a chaperone present.  Constitutional:      General: She is awake. She is not in acute distress.    Appearance: She is well-developed and well-groomed. She is not ill-appearing or toxic-appearing.  HENT:     Head: Normocephalic and atraumatic.     Right Ear: Hearing, tympanic membrane, ear canal and external ear normal. No drainage.     Left Ear: Hearing, tympanic membrane, ear canal and external ear normal. No drainage.     Nose: Nose normal.     Right Sinus: No maxillary sinus tenderness or frontal sinus tenderness.     Left Sinus: No maxillary sinus tenderness or frontal sinus tenderness.  Mouth/Throat:     Mouth: Mucous membranes are moist.     Pharynx: Oropharynx is clear. Uvula midline. No pharyngeal swelling, oropharyngeal exudate or posterior oropharyngeal erythema.  Eyes:     General: Lids are normal.        Right eye: No discharge.        Left eye: No discharge.     Extraocular Movements: Extraocular movements intact.     Conjunctiva/sclera: Conjunctivae normal.     Pupils: Pupils are equal, round, and reactive to light.     Visual Fields: Right eye visual fields normal and left eye visual fields normal.  Neck:     Thyroid: No thyromegaly.     Vascular: No carotid bruit.     Trachea: Trachea normal.  Cardiovascular:     Rate and Rhythm: Normal rate and regular rhythm.     Heart sounds: Normal heart sounds. No murmur heard.    No gallop.  Pulmonary:     Effort: Pulmonary effort is normal. No accessory muscle usage or respiratory distress.     Breath sounds: Normal breath sounds.   Chest:  Breasts:    Right: Normal.     Left: Normal.  Abdominal:     General: Bowel sounds are normal.     Palpations: Abdomen is soft. There is no hepatomegaly or splenomegaly.     Tenderness: There is no abdominal tenderness.  Musculoskeletal:        General: Normal range of motion.     Cervical back: Normal range of motion and neck supple.     Right lower leg: No edema.     Left lower leg: No edema.  Lymphadenopathy:     Head:     Right side of head: No submental, submandibular, tonsillar, preauricular or posterior auricular adenopathy.     Left side of head: No submental, submandibular, tonsillar, preauricular or posterior auricular adenopathy.     Cervical: No cervical adenopathy.     Upper Body:     Right upper body: No supraclavicular, axillary or pectoral adenopathy.     Left upper body: No supraclavicular, axillary or pectoral adenopathy.  Skin:    General: Skin is warm and dry.     Capillary Refill: Capillary refill takes less than 2 seconds.     Findings: No rash.  Neurological:     Mental Status: She is alert and oriented to person, place, and time.     Gait: Gait is intact.     Deep Tendon Reflexes: Reflexes are normal and symmetric.     Reflex Scores:      Brachioradialis reflexes are 2+ on the right side and 2+ on the left side.      Patellar reflexes are 2+ on the right side and 2+ on the left side. Psychiatric:        Attention and Perception: Attention normal.        Mood and Affect: Mood normal.        Speech: Speech normal.        Behavior: Behavior normal. Behavior is cooperative.        Thought Content: Thought content normal.        Judgment: Judgment normal.    Results for orders placed or performed in visit on 05/12/23  CBC with Differential/Platelet   Collection Time: 05/12/23  4:19 PM  Result Value Ref Range   WBC 10.7 3.4 - 10.8 x10E3/uL   RBC 4.39 3.77 - 5.28 x10E6/uL   Hemoglobin 13.8 11.1 -  15.9 g/dL   Hematocrit 57.1 65.9 - 46.6 %    MCV 98 (H) 79 - 97 fL   MCH 31.4 26.6 - 33.0 pg   MCHC 32.2 31.5 - 35.7 g/dL   RDW 88.2 88.2 - 84.5 %   Platelets 295 150 - 450 x10E3/uL   Neutrophils 58 Not Estab. %   Lymphs 33 Not Estab. %   Monocytes 7 Not Estab. %   Eos 1 Not Estab. %   Basos 1 Not Estab. %   Neutrophils Absolute 6.2 1.4 - 7.0 x10E3/uL   Lymphocytes Absolute 3.6 (H) 0.7 - 3.1 x10E3/uL   Monocytes Absolute 0.8 0.1 - 0.9 x10E3/uL   EOS (ABSOLUTE) 0.1 0.0 - 0.4 x10E3/uL   Basophils Absolute 0.1 0.0 - 0.2 x10E3/uL   Immature Granulocytes 0 Not Estab. %   Immature Grans (Abs) 0.0 0.0 - 0.1 x10E3/uL  Comprehensive metabolic panel   Collection Time: 05/12/23  4:19 PM  Result Value Ref Range   Glucose 87 70 - 99 mg/dL   BUN 21 6 - 24 mg/dL   Creatinine, Ser 9.12 0.57 - 1.00 mg/dL   eGFR 80 >40 fO/fpw/8.26   BUN/Creatinine Ratio 24 (H) 9 - 23   Sodium 136 134 - 144 mmol/L   Potassium 4.5 3.5 - 5.2 mmol/L   Chloride 99 96 - 106 mmol/L   CO2 22 20 - 29 mmol/L   Calcium 9.8 8.7 - 10.2 mg/dL   Total Protein 7.0 6.0 - 8.5 g/dL   Albumin 4.4 3.8 - 4.9 g/dL   Globulin, Total 2.6 1.5 - 4.5 g/dL   Bilirubin Total 0.3 0.0 - 1.2 mg/dL   Alkaline Phosphatase 66 44 - 121 IU/L   AST 35 0 - 40 IU/L   ALT 40 (H) 0 - 32 IU/L  TSH   Collection Time: 05/12/23  4:19 PM  Result Value Ref Range   TSH 2.160 0.450 - 4.500 uIU/mL  Lipid Panel w/o Chol/HDL Ratio   Collection Time: 05/12/23  4:19 PM  Result Value Ref Range   Cholesterol, Total 251 (H) 100 - 199 mg/dL   Triglycerides 885 0 - 149 mg/dL   HDL 79 >60 mg/dL   VLDL Cholesterol Cal 20 5 - 40 mg/dL   LDL Chol Calc (NIH) 847 (H) 0 - 99 mg/dL      Assessment & Plan:   Problem List Items Addressed This Visit       Other   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  Noted on past labs with ASCVD <7% -- recheck lipid panel and continue diet /exercise focus. The 10-year ASCVD risk score (Arnett DK, et al., 2019) is: 1.1%    Values used to calculate the score:     Age: 25 years     Clincally relevant sex: Female     Is Non-Hispanic African American: No     Diabetic: No     Tobacco smoker: No     Systolic Blood Pressure: 112 mmHg     Is BP treated: No     HDL Cholesterol: 79 mg/dL     Total Cholesterol: 251 mg/dL       Relevant Orders   Comprehensive metabolic panel with GFR   Lipid Panel w/o Chol/HDL Ratio   BMI 32.0-32.9,adult   On Wegovy  and tolerating well with 56 lbs lost thus far and BMI trended down + weight almost at her goal.  Will continue current dosing and adjust as  needed, send refills when requested.  No family history of thyroid cancer (MTC, MEN 2, thyroid cell tumors) or pancreatitis.  Educated her on medication and side effects.  Recommended eating smaller high protein, low fat meals more frequently and exercising 30 mins a day 5 times a week with a goal of 10-15lb weight loss in the next 3 months. Patient voiced their understanding and motivation to adhere to these recommendations. She is very active at baseline and focuses on diet, which are beneficial.          Other Visit Diagnoses       Encounter for annual physical exam       Annual physical today with labs and health maintenance reviewed, discussed with patient.   Relevant Orders   CBC with Differential/Platelet   TSH        Follow up plan: Return in about 6 months (around 11/15/2024) for WEIGHT CHECK.   LABORATORY TESTING:  - Pap smear: Up To Date  IMMUNIZATIONS:   - Tdap: Tetanus vaccination status reviewed: last tetanus booster within 10 years - Pneumovax: Not applicable - Prevnar: Not applicable - HPV: Not applicable - Zostavax vaccine: Refused -- wishes to wait  SCREENING: -Mammogram: Up To Date due 07/07/24 - Colonoscopy: Up To Date due next 07/18/2027 - Bone Density: Not applicable  -Hearing Test: Not applicable  -Spirometry: Not applicable   PATIENT COUNSELING:   Advised to take 1 mg of folate supplement  per day if capable of pregnancy.   Sexuality: Discussed sexually transmitted diseases, partner selection, use of condoms, avoidance of unintended pregnancy  and contraceptive alternatives.   Advised to avoid cigarette smoking.  I discussed with the patient that most people either abstain from alcohol or drink within safe limits (<=14/week and <=4 drinks/occasion for males, <=7/weeks and <= 3 drinks/occasion for females) and that the risk for alcohol disorders and other health effects rises proportionally with the number of drinks per week and how often a drinker exceeds daily limits.  Discussed cessation/primary prevention of drug use and availability of treatment for abuse.   Diet: Encouraged to adjust caloric intake to maintain  or achieve ideal body weight, to reduce intake of dietary saturated fat and total fat, to limit sodium intake by avoiding high sodium foods and not adding table salt, and to maintain adequate dietary potassium and calcium preferably from fresh fruits, vegetables, and low-fat dairy products.    Stressed the importance of regular exercise  Injury prevention: Discussed safety belts, safety helmets, smoke detector, smoking near bedding or upholstery.   Dental health: Discussed importance of regular tooth brushing, flossing, and dental visits.    NEXT PREVENTATIVE PHYSICAL DUE IN 1 YEAR. Return in about 6 months (around 11/15/2024) for WEIGHT CHECK.

## 2024-05-18 NOTE — Assessment & Plan Note (Signed)
 On Wegovy  and tolerating well with 56 lbs lost thus far and BMI trended down + weight almost at her goal.  Will continue current dosing and adjust as needed, send refills when requested.  No family history of thyroid cancer (MTC, MEN 2, thyroid cell tumors) or pancreatitis.  Educated her on medication and side effects.  Recommended eating smaller high protein, low fat meals more frequently and exercising 30 mins a day 5 times a week with a goal of 10-15lb weight loss in the next 3 months. Patient voiced their understanding and motivation to adhere to these recommendations. She is very active at baseline and focuses on diet, which are beneficial.

## 2024-05-18 NOTE — Assessment & Plan Note (Signed)
 Ongoing.  Noted on past labs with ASCVD <7% -- recheck lipid panel and continue diet /exercise focus. The 10-year ASCVD risk score (Arnett DK, et al., 2019) is: 1.1%   Values used to calculate the score:     Age: 53 years     Clincally relevant sex: Female     Is Non-Hispanic African American: No     Diabetic: No     Tobacco smoker: No     Systolic Blood Pressure: 112 mmHg     Is BP treated: No     HDL Cholesterol: 79 mg/dL     Total Cholesterol: 251 mg/dL

## 2024-05-19 ENCOUNTER — Ambulatory Visit: Payer: Self-pay | Admitting: Nurse Practitioner

## 2024-05-19 ENCOUNTER — Other Ambulatory Visit: Payer: Self-pay | Admitting: Medical Genetics

## 2024-05-19 LAB — CBC WITH DIFFERENTIAL/PLATELET
Basophils Absolute: 0.1 x10E3/uL (ref 0.0–0.2)
Basos: 1 %
EOS (ABSOLUTE): 0.1 x10E3/uL (ref 0.0–0.4)
Eos: 1 %
Hematocrit: 40.4 % (ref 34.0–46.6)
Hemoglobin: 13.3 g/dL (ref 11.1–15.9)
Immature Grans (Abs): 0 x10E3/uL (ref 0.0–0.1)
Immature Granulocytes: 0 %
Lymphocytes Absolute: 3.8 x10E3/uL — ABNORMAL HIGH (ref 0.7–3.1)
Lymphs: 47 %
MCH: 32 pg (ref 26.6–33.0)
MCHC: 32.9 g/dL (ref 31.5–35.7)
MCV: 97 fL (ref 79–97)
Monocytes Absolute: 0.5 x10E3/uL (ref 0.1–0.9)
Monocytes: 7 %
Neutrophils Absolute: 3.4 x10E3/uL (ref 1.4–7.0)
Neutrophils: 44 %
Platelets: 258 x10E3/uL (ref 150–450)
RBC: 4.15 x10E6/uL (ref 3.77–5.28)
RDW: 11.5 % — ABNORMAL LOW (ref 11.7–15.4)
WBC: 7.8 x10E3/uL (ref 3.4–10.8)

## 2024-05-19 LAB — COMPREHENSIVE METABOLIC PANEL WITH GFR
ALT: 19 IU/L (ref 0–32)
AST: 19 IU/L (ref 0–40)
Albumin: 4.6 g/dL (ref 3.8–4.9)
Alkaline Phosphatase: 47 IU/L — ABNORMAL LOW (ref 49–135)
BUN/Creatinine Ratio: 26 — ABNORMAL HIGH (ref 9–23)
BUN: 23 mg/dL (ref 6–24)
Bilirubin Total: 0.3 mg/dL (ref 0.0–1.2)
CO2: 25 mmol/L (ref 20–29)
Calcium: 9.8 mg/dL (ref 8.7–10.2)
Chloride: 98 mmol/L (ref 96–106)
Creatinine, Ser: 0.9 mg/dL (ref 0.57–1.00)
Globulin, Total: 1.9 g/dL (ref 1.5–4.5)
Glucose: 81 mg/dL (ref 70–99)
Potassium: 4.1 mmol/L (ref 3.5–5.2)
Sodium: 137 mmol/L (ref 134–144)
Total Protein: 6.5 g/dL (ref 6.0–8.5)
eGFR: 76 mL/min/1.73 (ref >59)

## 2024-05-19 LAB — LIPID PANEL W/O CHOL/HDL RATIO
Cholesterol, Total: 213 mg/dL — ABNORMAL HIGH (ref 100–199)
HDL: 74 mg/dL (ref >39)
LDL Chol Calc (NIH): 131 mg/dL — ABNORMAL HIGH (ref 0–99)
Triglycerides: 44 mg/dL (ref 0–149)
VLDL Cholesterol Cal: 8 mg/dL (ref 5–40)

## 2024-05-19 LAB — TSH: TSH: 2.11 u[IU]/mL (ref 0.450–4.500)

## 2024-05-19 NOTE — Progress Notes (Signed)
 Contacted via MyChart The 10-year ASCVD risk score (Arnett DK, et al., 2019) is: 1%   Values used to calculate the score:     Age: 54 years     Clincally relevant sex: Female     Is Non-Hispanic African American: No     Diabetic: No     Tobacco smoker: No     Systolic Blood Pressure: 112 mmHg     Is BP treated: No     HDL Cholesterol: 74 mg/dL     Total Cholesterol: 213 mg/dL  Good morning Jaclyn West, your labs have returned: - Kidney function, creatinine and eGFR, remains normal, as is liver function, AST and ALT.  - Lipid panel continues to show elevations, but no medications needed at this time. Continue focus on diet and regular exercise. - CBC shows normal white blood cell count, but lymphocytes have been mildly elevated since 2022. We will continue to monitor this at visits. Could be related to inflammation or stress. - TSH, thyroid, normal. Any questions? Keep being amazing!!  Thank you for allowing me to participate in your care.  I appreciate you. Kindest regards, Jamill Wetmore

## 2024-06-29 ENCOUNTER — Other Ambulatory Visit: Payer: Self-pay | Admitting: Nurse Practitioner

## 2024-06-29 DIAGNOSIS — Z1231 Encounter for screening mammogram for malignant neoplasm of breast: Secondary | ICD-10-CM

## 2024-07-20 ENCOUNTER — Ambulatory Visit
Admission: RE | Admit: 2024-07-20 | Discharge: 2024-07-20 | Disposition: A | Source: Ambulatory Visit | Attending: Nurse Practitioner | Admitting: Nurse Practitioner

## 2024-07-20 DIAGNOSIS — Z1231 Encounter for screening mammogram for malignant neoplasm of breast: Secondary | ICD-10-CM

## 2024-07-28 ENCOUNTER — Other Ambulatory Visit: Payer: Self-pay | Admitting: Medical Genetics

## 2024-07-28 DIAGNOSIS — Z006 Encounter for examination for normal comparison and control in clinical research program: Secondary | ICD-10-CM

## 2024-07-29 ENCOUNTER — Ambulatory Visit: Payer: Self-pay | Admitting: Nurse Practitioner

## 2024-07-29 NOTE — Progress Notes (Signed)
 Contacted via MyChart   Normal mammogram, may repeat in one year:)

## 2024-11-16 ENCOUNTER — Ambulatory Visit: Admitting: Nurse Practitioner
